# Patient Record
Sex: Female | Born: 1980 | Race: Black or African American | Hispanic: No | Marital: Married | Smoking: Never smoker
Health system: Southern US, Community
[De-identification: ages and names within clinical notes are randomized; demographics above are authoritative.]

## PROBLEM LIST (undated history)

## (undated) DIAGNOSIS — O161 Unspecified maternal hypertension, first trimester: Secondary | ICD-10-CM

## (undated) DIAGNOSIS — E119 Type 2 diabetes mellitus without complications: Secondary | ICD-10-CM

## (undated) HISTORY — DX: Unspecified maternal hypertension, first trimester: O16.1

## (undated) HISTORY — DX: Type 2 diabetes mellitus without complications: E11.9

---

## 2020-01-16 DIAGNOSIS — O321XX0 Maternal care for breech presentation, not applicable or unspecified: Secondary | ICD-10-CM

## 2021-02-10 ENCOUNTER — Ambulatory Visit: Payer: Self-pay | Admitting: Nurse Practitioner

## 2021-02-16 ENCOUNTER — Other Ambulatory Visit: Payer: Self-pay

## 2021-02-16 ENCOUNTER — Ambulatory Visit: Payer: BC Managed Care – PPO | Admitting: Nurse Practitioner

## 2021-02-16 ENCOUNTER — Encounter: Payer: Self-pay | Admitting: Nurse Practitioner

## 2021-02-16 VITALS — BP 136/84 | HR 88 | Temp 98.1°F | Resp 16 | Ht 64.0 in | Wt 166.5 lb

## 2021-02-16 DIAGNOSIS — Z131 Encounter for screening for diabetes mellitus: Secondary | ICD-10-CM

## 2021-02-16 DIAGNOSIS — Z8632 Personal history of gestational diabetes: Secondary | ICD-10-CM

## 2021-02-16 DIAGNOSIS — R030 Elevated blood-pressure reading, without diagnosis of hypertension: Secondary | ICD-10-CM | POA: Diagnosis not present

## 2021-02-16 DIAGNOSIS — Z13220 Encounter for screening for lipoid disorders: Secondary | ICD-10-CM | POA: Diagnosis not present

## 2021-02-16 DIAGNOSIS — Z7689 Persons encountering health services in other specified circumstances: Secondary | ICD-10-CM

## 2021-02-16 LAB — HEMOGLOBIN A1C
Hgb A1c MFr Bld: 13 % of total Hgb — ABNORMAL HIGH (ref <5.7)
Mean Plasma Glucose: 326 mg/dL
eAG (mmol/L): 18.1 mmol/L

## 2021-02-16 LAB — CBC WITH DIFFERENTIAL/PLATELET
Absolute Monocytes: 343 cells/uL (ref 200–950)
Basophils Absolute: 71 cells/uL (ref 0–200)
Basophils Relative: 1.5 %
Eosinophils Absolute: 42 cells/uL (ref 15–500)
Eosinophils Relative: 0.9 %
HCT: 43.9 % (ref 35.0–45.0)
Hemoglobin: 14.5 g/dL (ref 11.7–15.5)
Lymphs Abs: 2077 cells/uL (ref 850–3900)
MCH: 29 pg (ref 27.0–33.0)
MCHC: 33 g/dL (ref 32.0–36.0)
MCV: 87.8 fL (ref 80.0–100.0)
MPV: 13.3 fL — ABNORMAL HIGH (ref 7.5–12.5)
Monocytes Relative: 7.3 %
Neutro Abs: 2167 cells/uL (ref 1500–7800)
Neutrophils Relative %: 46.1 %
Platelets: 204 10*3/uL (ref 140–400)
RBC: 5 10*6/uL (ref 3.80–5.10)
RDW: 12 % (ref 11.0–15.0)
Total Lymphocyte: 44.2 %
WBC: 4.7 10*3/uL (ref 3.8–10.8)

## 2021-02-16 LAB — LIPID PANEL
Cholesterol: 232 mg/dL — ABNORMAL HIGH (ref <200)
HDL: 46 mg/dL — ABNORMAL LOW (ref >=50)
LDL Cholesterol (Calc): 147 mg/dL (calc) — ABNORMAL HIGH
Non-HDL Cholesterol (Calc): 186 mg/dL (calc) — ABNORMAL HIGH (ref <130)
Total CHOL/HDL Ratio: 5 (calc) — ABNORMAL HIGH (ref <5.0)
Triglycerides: 239 mg/dL — ABNORMAL HIGH (ref <150)

## 2021-02-16 LAB — COMPLETE METABOLIC PANEL WITH GFR
AG Ratio: 1.4 (calc) (ref 1.0–2.5)
ALT: 14 U/L (ref 6–29)
AST: 17 U/L (ref 10–30)
Albumin: 4.2 g/dL (ref 3.6–5.1)
Alkaline phosphatase (APISO): 124 U/L (ref 31–125)
BUN: 8 mg/dL (ref 7–25)
CO2: 28 mmol/L (ref 20–32)
Calcium: 9.4 mg/dL (ref 8.6–10.2)
Chloride: 99 mmol/L (ref 98–110)
Creat: 0.96 mg/dL (ref 0.50–0.97)
Globulin: 3 g/dL (calc) (ref 1.9–3.7)
Glucose, Bld: 342 mg/dL — ABNORMAL HIGH (ref 65–99)
Potassium: 4.6 mmol/L (ref 3.5–5.3)
Sodium: 133 mmol/L — ABNORMAL LOW (ref 135–146)
Total Bilirubin: 0.4 mg/dL (ref 0.2–1.2)
Total Protein: 7.2 g/dL (ref 6.1–8.1)
eGFR: 77 mL/min/{1.73_m2} (ref >=60)

## 2021-02-16 NOTE — Progress Notes (Addendum)
BP 136/84    Pulse 88    Temp 98.1 F (36.7 C) (Oral)    Resp 16    Ht 5\' 4"  (1.626 m)    Wt 166 lb 8 oz (75.5 kg)    LMP 01/30/2021    SpO2 99%    BMI 28.58 kg/m    Subjective:    Patient ID: 02/01/2021, female    DOB: 08/20/1980, 41 y.o.   MRN: [id number]  HPI: Jessica Patel is a 41 y.o. female, here with husband  Chief Complaint  Patient presents with   Establish Care   Hypertension   Hyperglycemia   High blood pressure reading:.  She says with her first and second pregnancy she had high blood pressure. She says she has a machine she uses at home to check her blood pressure.  Her blood pressure was 140's/80s at home. Her blood pressure here was 136/84.  She denies any chest pain, shortness of breath, lower extremity edema or blurred vision. Discussed decreasing salt intake. Continue to monitor blood pressure.  She is going to bring her blood pressure machine to check for accuracy at next visit. Will get labs.  Gestational diabetes:  She says she had gestational diabetes with her first pregnancy.  She denies any polyuria, polydipsia, or polyphagia.  Establish care:  She says her last physical was in August.  She says her only history is that her blood sugar was high with her first pregnancy and that she had high blood pressure with both pregnancies.  She has two daughters ages one and three. She is going to sign the release to get records from her previous provider.   Depression screening negative Depression screen [facility] 2/9 02/16/2021  Decreased Interest 0  Down, Depressed, Hopeless 0  PHQ - 2 Score 0  Altered sleeping 0  Tired, decreased energy 0  Change in appetite 0  Feeling bad or failure about yourself  0  Trouble concentrating 0  Moving slowly or fidgety/restless 0  Suicidal thoughts 0  PHQ-9 Score 0  Difficult doing work/chores Not difficult at all     Relevant past medical, surgical, family and social history reviewed and updated as indicated. Interim medical  history since our last visit reviewed. Allergies and medications reviewed and updated.  Review of Systems  Constitutional: Negative for fever or weight change.  Respiratory: Negative for cough and shortness of breath.   Cardiovascular: Negative for chest pain or palpitations.  Gastrointestinal: Negative for abdominal pain, no bowel changes.  Musculoskeletal: Negative for gait problem or joint swelling.  Skin: Negative for rash.  Neurological: Negative for dizziness or headache.  No other specific complaints in a complete review of systems (except as listed in HPI above).      Objective:    BP 136/84    Pulse 88    Temp 98.1 F (36.7 C) (Oral)    Resp 16    Ht 5\' 4"  (1.626 m)    Wt 166 lb 8 oz (75.5 kg)    LMP 01/30/2021    SpO2 99%    BMI 28.58 kg/m   Wt Readings from Last 3 Encounters:  02/16/21 166 lb 8 oz (75.5 kg)    Physical Exam  Constitutional: Patient appears well-developed and well-nourished. Overweight, No distress.  HEENT: head atraumatic, normocephalic, pupils equal and reactive to light, neck supple, throat within normal limits Cardiovascular: Normal rate, regular rhythm and normal heart sounds.  No murmur heard. No BLE edema. Pulmonary/Chest: Effort normal and  breath sounds normal. No respiratory distress. Abdominal: Soft.  There is no tenderness. Psychiatric: Patient has a normal mood and affect. behavior is normal. Judgment and thought content normal.     Assessment & Plan:   1. Elevated blood pressure reading -decrease salt intake -push water -monitor blood pressure and being machine to next appointment - CBC with Differential/Platelet - COMPLETE METABOLIC PANEL WITH GFR  2. History of gestational diabetes  - Hemoglobin A1c  3. Encounter to establish care - CBC with Differential/Platelet - COMPLETE METABOLIC PANEL WITH GFR - Lipid panel - Hemoglobin A1c  4. Screening for cholesterol level  - Lipid panel  5. Screening for diabetes mellitus  -  COMPLETE METABOLIC PANEL WITH GFR - Hemoglobin A1c   Follow up plan: Return in about 4 weeks (around 03/16/2021) for follow up.

## 2021-02-17 ENCOUNTER — Encounter: Payer: Self-pay | Admitting: Nurse Practitioner

## 2021-02-17 ENCOUNTER — Other Ambulatory Visit: Payer: Self-pay

## 2021-02-17 ENCOUNTER — Telehealth (INDEPENDENT_AMBULATORY_CARE_PROVIDER_SITE_OTHER): Payer: BC Managed Care – PPO | Admitting: Nurse Practitioner

## 2021-02-17 DIAGNOSIS — E119 Type 2 diabetes mellitus without complications: Secondary | ICD-10-CM

## 2021-02-17 DIAGNOSIS — E782 Mixed hyperlipidemia: Secondary | ICD-10-CM | POA: Diagnosis not present

## 2021-02-17 MED ORDER — BLOOD GLUCOSE MONITOR KIT: PACK | 0 refills | Status: DC

## 2021-02-17 MED ORDER — PIOGLITAZONE HCL 15 MG PO TABS: 15.0000 mg | ORAL_TABLET | Freq: Every day | ORAL | 0 refills | Status: DC

## 2021-02-17 MED ORDER — METFORMIN HCL 500 MG PO TABS: 500.0000 mg | ORAL_TABLET | Freq: Two times a day (BID) | ORAL | 0 refills | Status: DC

## 2021-02-17 NOTE — Progress Notes (Signed)
Name: Jessica Patel   MRN: [id number]    DOB: [birth year]   Date:02/17/2021       Progress Note  Subjective  Chief Complaint  Chief Complaint  Patient presents with   Diabetes    New onset     I connected with  Arnoldo Hooker  on 02/17/21 at  1:00 PM EST by a video enabled telemedicine application and verified that I am speaking with the correct person using two identifiers.  I discussed the limitations of evaluation and management by telemedicine and the availability of in person appointments. The patient expressed understanding and agreed to proceed with a virtual visit  Staff also discussed with the patient that there may be a patient responsible charge related to this service. Patient Location: home Provider Location: [facility] Additional Individuals present: with husband  HPI  DM2: This appointment was scheduled to discuss lab results.  Discussed her lab work.  Discussed diabetes diagnosis. Her A1C is 13.  Discussed diabetes education and it was declined at this time.  Discussed ways to decrease sugar with life style modification. Discussed starting medication and different options.  Will start with actos 15 mg daily  and metformin 500 mg bid.  She is agreeable with plan.  Prescribed glucose monitor.  Discussed checking her blood sugar three times a week in the morning before she eats and keeping a record. She denies any polyuria, polydipsia or polyphagia.   Hyperlipidemia:  She is not currently on any medication. Her LDL was 147.  Her triglycerides were 239 but she was not fasting at that time.  Discussed ways to decrease LDL with diet.  Will continue to monitor.   There are no problems to display for this patient.   Social History   Tobacco Use   Smoking status: Never   Smokeless tobacco: Never  Substance Use Topics   Alcohol use: Never     Current Outpatient Medications:    diclofenac (VOLTAREN) 75 MG EC tablet, Take 75 mg by mouth 2 (two) times daily. (Patient not taking:  Reported on 02/16/2021), Disp: , Rfl:   No Known Allergies  I personally reviewed active problem list, medication list, allergies, notes from last encounter with the patient/caregiver today.  ROS  Constitutional: Negative for fever or weight change.  Respiratory: Negative for cough and shortness of breath.   Cardiovascular: Negative for chest pain or palpitations.  Gastrointestinal: Negative for abdominal pain, no bowel changes.  Musculoskeletal: Negative for gait problem or joint swelling.  Skin: Negative for rash.  Neurological: Negative for dizziness or headache.  No other specific complaints in a complete review of systems (except as listed in HPI above).   Objective  Virtual encounter, vitals not obtained.  There is no height or weight on file to calculate BMI.  Nursing Note and Vital Signs reviewed.  Physical Exam  Awake, alert and oriented  Results for orders placed or performed in visit on 02/16/21 (from the past 72 hour(s))  CBC with Differential/Platelet     Status: Abnormal   Collection Time: 02/16/21  2:45 PM  Result Value Ref Range   WBC 4.7 3.8 - 10.8 Thousand/uL   RBC 5.00 3.80 - 5.10 Million/uL   Hemoglobin 14.5 11.7 - 15.5 g/dL   HCT 43.9 35.0 - 45.0 %   MCV 87.8 80.0 - 100.0 fL   MCH 29.0 27.0 - 33.0 pg   MCHC 33.0 32.0 - 36.0 g/dL   RDW 12.0 11.0 - 15.0 %   Platelets 204 140 -  400 Thousand/uL   MPV 13.3 (H) 7.5 - 12.5 fL   Neutro Abs 2,167 1,500 - 7,800 cells/uL   Lymphs Abs 2,077 850 - 3,900 cells/uL   Absolute Monocytes 343 200 - 950 cells/uL   Eosinophils Absolute 42 15 - 500 cells/uL   Basophils Absolute 71 0 - 200 cells/uL   Neutrophils Relative % 46.1 %   Total Lymphocyte 44.2 %   Monocytes Relative 7.3 %   Eosinophils Relative 0.9 %   Basophils Relative 1.5 %  COMPLETE METABOLIC PANEL WITH GFR     Status: Abnormal   Collection Time: 02/16/21  2:45 PM  Result Value Ref Range   Glucose, Bld 342 (H) 65 - 99 mg/dL    Comment: .             Fasting reference interval . For someone without known diabetes, a glucose value >125 mg/dL indicates that they may have diabetes and this should be confirmed with a follow-up test. .    BUN 8 7 - 25 mg/dL   Creat 0.96 0.50 - 0.97 mg/dL   eGFR 77 > OR = 60 mL/min/1.73m    Comment: The eGFR is based on the CKD-EPI 2021 equation. To calculate  the new eGFR from a previous Creatinine or Cystatin C result, go to https://www.kidney.org/professionals/ kdoqi/gfr%5Fcalculator    BUN/Creatinine Ratio NOT APPLICABLE 6 - 22 (calc)   Sodium 133 (L) 135 - 146 mmol/L   Potassium 4.6 3.5 - 5.3 mmol/L   Chloride 99 98 - 110 mmol/L   CO2 28 20 - 32 mmol/L   Calcium 9.4 8.6 - 10.2 mg/dL   Total Protein 7.2 6.1 - 8.1 g/dL   Albumin 4.2 3.6 - 5.1 g/dL   Globulin 3.0 1.9 - 3.7 g/dL (calc)   AG Ratio 1.4 1.0 - 2.5 (calc)   Total Bilirubin 0.4 0.2 - 1.2 mg/dL   Alkaline phosphatase (APISO) 124 31 - 125 U/L   AST 17 10 - 30 U/L   ALT 14 6 - 29 U/L  Lipid panel     Status: Abnormal   Collection Time: 02/16/21  2:45 PM  Result Value Ref Range   Cholesterol 232 (H) <200 mg/dL   HDL 46 (L) > OR = 50 mg/dL   Triglycerides 239 (H) <150 mg/dL    Comment: . If a non-fasting specimen was collected, consider repeat triglyceride testing on a fasting specimen if clinically indicated.  JYates Decampet al. J. of Clin. Lipidol. [id number];1:791-505 .Marland Kitchen   LDL Cholesterol (Calc) 147 (H) mg/dL (calc)    Comment: Reference range: <100 . Desirable range <100 mg/dL for primary prevention;   <70 mg/dL for patients with CHD or diabetic patients  with > or = 2 CHD risk factors. .Marland KitchenLDL-C is now calculated using the Martin-Hopkins  calculation, which is a validated novel method providing  better accuracy than the Friedewald equation in the  estimation of LDL-C.  MCresenciano Genreet al. JAnnamaria Helling [id number];480(16: 2061-2068  (http://education.[employer].com/faq/FAQ164)    Total CHOL/HDL Ratio 5.0 (H) <5.0 (calc)   Non-HDL  Cholesterol (Calc) 186 (H) <130 mg/dL (calc)    Comment: For patients with diabetes plus 1 major ASCVD risk  factor, treating to a non-HDL-C goal of <100 mg/dL  (LDL-C of <70 mg/dL) is considered a therapeutic  option.   Hemoglobin A1c     Status: Abnormal   Collection Time: 02/16/21  2:45 PM  Result Value Ref Range   Hgb A1c MFr Bld 13.0 (H) <5.7 % of  total Hgb    Comment: For someone without known diabetes, a hemoglobin A1c value of 6.5% or greater indicates that they may have  diabetes and this should be confirmed with a follow-up  test. . For someone with known diabetes, a value <7% indicates  that their diabetes is well controlled and a value  greater than or equal to 7% indicates suboptimal  control. A1c targets should be individualized based on  duration of diabetes, age, comorbid conditions, and  other considerations. . Currently, no consensus exists regarding use of hemoglobin A1c for diagnosis of diabetes for children. .    Mean Plasma Glucose 326 mg/dL   eAG (mmol/L) 18.1 mmol/L    Assessment & Plan  1. Type 2 diabetes mellitus without complication, without long-term current use of insulin (HCC) -discussed life style modifications -discussed checking fasting blood sugar - metFORMIN (GLUCOPHAGE) 500 MG tablet; Take 1 tablet (500 mg total) by mouth 2 (two) times daily with a meal.  Dispense: 180 tablet; Refill: 0 - blood glucose meter kit and supplies KIT; Dispense based on patient and insurance preference. Use up to four times daily as directed.  Dispense: 1 each; Refill: 0 - pioglitazone (ACTOS) 15 MG tablet; Take 1 tablet (15 mg total) by mouth daily.  Dispense: 90 tablet; Refill: 0  2. Mixed hyperlipidemia -discussed life style modifications   -Red flags and when to present for emergency care or RTC including fever >101.51F, chest pain, shortness of breath, new/worsening/un-resolving symptoms,  reviewed with patient at time of visit. Follow up and care  instructions discussed and provided in AVS. - I discussed the assessment and treatment plan with the patient. The patient was provided an opportunity to ask questions and all were answered. The patient agreed with the plan and demonstrated an understanding of the instructions.  I provided 20 minutes of non-face-to-face time during this encounter.  Bo Merino, FNP

## 2021-03-17 ENCOUNTER — Other Ambulatory Visit: Payer: Self-pay

## 2021-03-17 ENCOUNTER — Encounter: Payer: Self-pay | Admitting: Nurse Practitioner

## 2021-03-17 ENCOUNTER — Ambulatory Visit: Payer: BC Managed Care – PPO | Admitting: Nurse Practitioner

## 2021-03-17 VITALS — BP 128/86 | HR 88 | Temp 98.3°F | Resp 18 | Ht 65.0 in | Wt 160.2 lb

## 2021-03-17 DIAGNOSIS — E119 Type 2 diabetes mellitus without complications: Secondary | ICD-10-CM | POA: Diagnosis not present

## 2021-03-17 DIAGNOSIS — R030 Elevated blood-pressure reading, without diagnosis of hypertension: Secondary | ICD-10-CM

## 2021-03-17 MED ORDER — METFORMIN HCL 500 MG PO TABS: 500.0000 mg | ORAL_TABLET | Freq: Two times a day (BID) | ORAL | 3 refills | Status: DC

## 2021-03-17 MED ORDER — PIOGLITAZONE HCL 15 MG PO TABS: 15.0000 mg | ORAL_TABLET | Freq: Every day | ORAL | 3 refills | Status: DC

## 2021-03-17 NOTE — Progress Notes (Signed)
BP 128/86    Pulse 88    Temp 98.3 F (36.8 C) (Oral)    Resp 18    Ht 5' 5"  (1.651 m)    Wt 160 lb 3.2 oz (72.7 kg)    LMP 03/05/2021    SpO2 98%    BMI 26.66 kg/m    Subjective:    Patient ID: Jessica Patel, female    DOB: 1980-09-18, 41 y.o.   MRN: [id number]  HPI: Jessica Patel is a 41 y.o. female, here with husband  Chief Complaint  Patient presents with   Diabetes   Elevated blood pressure reading: She says she has had elevated blood pressure at home. Had her bring her machine in and it is giving inaccurate blood pressure readings compared to what was gotten in the office.  Discussed she needs a new machine this one is not calibrated.  Blood pressure in the office is 128/86.  She says she sometimes has headaches. She denies any chest pain, shortness of breath or blurred vision.  Recommended she get another machine and we will continue to monitor blood pressure in the office.   Type 2 diabetes:  She was recently diagnosed with diabetes on 02/16/2021.  Her A1C was 13. She was prescribed actos and metformin but never started the medication.  Her husband stated that there was a problem with their insurance card and it was too expensive.  Looked up medication on [facility] and gave husband [facility] card to get prescriptions.  She say she has been checking her blood sugar and it has been around 120s.  She denies any polyuria, polydipsia and polyphagia.  Discussed routine testing that will need to be done now that she is diabetic.  Foot exam performed today and placed referral for DM eye exam.  Will also get urine microalbumin.  Follow-up in  three months.   Relevant past medical, surgical, family and social history reviewed and updated as indicated. Interim medical history since our last visit reviewed. Allergies and medications reviewed and updated.  Review of Systems  Constitutional: Negative for fever or weight change.  Respiratory: Negative for cough and shortness of  breath.   Cardiovascular: Negative for chest pain or palpitations.  Gastrointestinal: Negative for abdominal pain, no bowel changes.  Musculoskeletal: Negative for gait problem or joint swelling.  Skin: Negative for rash.  Neurological: Negative for dizziness, positive headache.  No other specific complaints in a complete review of systems (except as listed in HPI above).      Objective:    BP 128/86    Pulse 88    Temp 98.3 F (36.8 C) (Oral)    Resp 18    Ht 5' 5"  (1.651 m)    Wt 160 lb 3.2 oz (72.7 kg)    LMP 03/05/2021    SpO2 98%    BMI 26.66 kg/m   Wt Readings from Last 3 Encounters:  03/17/21 160 lb 3.2 oz (72.7 kg)  02/16/21 166 lb 8 oz (75.5 kg)    Physical Exam  Constitutional: Patient appears well-developed and well-nourished. Overweight No distress.  HEENT: head atraumatic, normocephalic, pupils equal and reactive to light, neck supple, throat within normal limits Cardiovascular: Normal rate, regular rhythm and normal heart sounds.  No murmur heard. No BLE edema. Pulmonary/Chest: Effort normal and breath sounds normal. No respiratory distress. Abdominal: Soft.  There is no tenderness. Psychiatric: Patient has a normal mood and affect. behavior is normal. Judgment and thought content normal.  Diabetic Foot Exam - Simple   Simple Foot Patel Diabetic Foot exam was performed with the following findings: Yes 03/17/2021  1:35 PM  Visual Inspection No deformities, no ulcerations, no other skin breakdown bilaterally: Yes Sensation Testing Intact to touch and monofilament testing bilaterally: Yes Pulse Check Posterior Tibialis and Dorsalis pulse intact bilaterally: Yes Comments     Results for orders placed or performed in visit on 02/16/21  CBC with Differential/Platelet  Result Value Ref Range   WBC 4.7 3.8 - 10.8 Thousand/uL   RBC 5.00 3.80 - 5.10 Million/uL   Hemoglobin 14.5 11.7 - 15.5 g/dL   HCT 43.9 35.0 - 45.0 %   MCV 87.8 80.0 - 100.0 fL   MCH 29.0 27.0 -  33.0 pg   MCHC 33.0 32.0 - 36.0 g/dL   RDW 12.0 11.0 - 15.0 %   Platelets 204 140 - 400 Thousand/uL   MPV 13.3 (H) 7.5 - 12.5 fL   Neutro Abs 2,167 1,500 - 7,800 cells/uL   Lymphs Abs 2,077 850 - 3,900 cells/uL   Absolute Monocytes 343 200 - 950 cells/uL   Eosinophils Absolute 42 15 - 500 cells/uL   Basophils Absolute 71 0 - 200 cells/uL   Neutrophils Relative % 46.1 %   Total Lymphocyte 44.2 %   Monocytes Relative 7.3 %   Eosinophils Relative 0.9 %   Basophils Relative 1.5 %  COMPLETE METABOLIC PANEL WITH GFR  Result Value Ref Range   Glucose, Bld 342 (H) 65 - 99 mg/dL   BUN 8 7 - 25 mg/dL   Creat 0.96 0.50 - 0.97 mg/dL   eGFR 77 > OR = 60 mL/min/1.75m2   BUN/Creatinine Ratio NOT APPLICABLE 6 - 22 (calc)   Sodium 133 (L) 135 - 146 mmol/L   Potassium 4.6 3.5 - 5.3 mmol/L   Chloride 99 98 - 110 mmol/L   CO2 28 20 - 32 mmol/L   Calcium 9.4 8.6 - 10.2 mg/dL   Total Protein 7.2 6.1 - 8.1 g/dL   Albumin 4.2 3.6 - 5.1 g/dL   Globulin 3.0 1.9 - 3.7 g/dL (calc)   AG Ratio 1.4 1.0 - 2.5 (calc)   Total Bilirubin 0.4 0.2 - 1.2 mg/dL   Alkaline phosphatase (APISO) 124 31 - 125 U/L   AST 17 10 - 30 U/L   ALT 14 6 - 29 U/L  Lipid panel  Result Value Ref Range   Cholesterol 232 (H) <200 mg/dL   HDL 46 (L) > OR = 50 mg/dL   Triglycerides 239 (H) <150 mg/dL   LDL Cholesterol (Calc) 147 (H) mg/dL (calc)   Total CHOL/HDL Ratio 5.0 (H) <5.0 (calc)   Non-HDL Cholesterol (Calc) 186 (H) <130 mg/dL (calc)  Hemoglobin A1c  Result Value Ref Range   Hgb A1c MFr Bld 13.0 (H) <5.7 % of total Hgb   Mean Plasma Glucose 326 mg/dL   eAG (mmol/L) 18.1 mmol/L      Assessment & Plan:   1. Elevated blood pressure reading -get new blood pressure machine -continue to monitor at appointments  2. Type 2 diabetes mellitus without complication, without long-term current use of insulin (HCC)  - HM Diabetes Foot Exam - Ambulatory referral to Optometry - Microalbumin / creatinine urine ratio -  pioglitazone (ACTOS) 15 MG tablet; Take 1 tablet (15 mg total) by mouth daily.  Dispense: 30 tablet; Refill: 3 - metFORMIN (GLUCOPHAGE) 500 MG tablet; Take 1 tablet (500 mg total) by mouth 2 (two) times daily with a meal.  Dispense: 60 tablet; Refill: 3   Follow up plan: Return in about 3 months (around 06/14/2021) for follow up.

## 2021-03-18 LAB — MICROALBUMIN / CREATININE URINE RATIO
Creatinine, Urine: 78 mg/dL (ref 20–275)
Microalb Creat Ratio: 6 mcg/mg creat (ref <30)
Microalb, Ur: 0.5 mg/dL

## 2021-04-05 ENCOUNTER — Ambulatory Visit: Payer: Self-pay | Admitting: Nurse Practitioner

## 2021-05-03 ENCOUNTER — Encounter: Payer: Self-pay | Admitting: Family Medicine

## 2021-06-14 ENCOUNTER — Ambulatory Visit: Payer: BC Managed Care – PPO | Admitting: Nurse Practitioner

## 2021-06-15 ENCOUNTER — Ambulatory Visit: Payer: BC Managed Care – PPO | Admitting: Nurse Practitioner

## 2021-06-25 ENCOUNTER — Ambulatory Visit: Payer: BC Managed Care – PPO | Admitting: Nurse Practitioner

## 2021-06-25 ENCOUNTER — Ambulatory Visit (INDEPENDENT_AMBULATORY_CARE_PROVIDER_SITE_OTHER): Payer: BC Managed Care – PPO | Admitting: Nurse Practitioner

## 2021-06-25 ENCOUNTER — Other Ambulatory Visit: Payer: Self-pay

## 2021-06-25 ENCOUNTER — Encounter: Payer: Self-pay | Admitting: Nurse Practitioner

## 2021-06-25 VITALS — BP 134/78 | HR 84 | Temp 98.4°F | Resp 16 | Ht 65.0 in | Wt 159.2 lb

## 2021-06-25 DIAGNOSIS — E1165 Type 2 diabetes mellitus with hyperglycemia: Secondary | ICD-10-CM

## 2021-06-25 DIAGNOSIS — R030 Elevated blood-pressure reading, without diagnosis of hypertension: Secondary | ICD-10-CM | POA: Diagnosis not present

## 2021-06-25 LAB — POCT GLYCOSYLATED HEMOGLOBIN (HGB A1C): Hemoglobin A1C: 9.9 % — AB (ref 4.0–5.6)

## 2021-06-25 MED ORDER — PIOGLITAZONE HCL 15 MG PO TABS: 15.0000 mg | ORAL_TABLET | Freq: Every day | ORAL | 3 refills | Status: DC

## 2021-06-25 MED ORDER — METFORMIN HCL 500 MG PO TABS: 500.0000 mg | ORAL_TABLET | Freq: Two times a day (BID) | ORAL | 3 refills | Status: DC

## 2021-06-25 NOTE — Progress Notes (Signed)
BP 134/78   Pulse 84   Temp 98.4 F (36.9 C) (Oral)   Resp 16   Ht 5\' 5"  (1.651 m)   Wt 159 lb 3.2 oz (72.2 kg)   SpO2 99%   BMI 26.49 kg/m    Subjective:    Patient ID: , female    DOB: 1980-06-05, 41 y.o.   MRN: [id number]  HPI: Jessica Patel is a 41 y.o. female  Chief Complaint  Patient presents with   Hypertension   Diabetes    3 month follow up   Uncontrolled type 2 diabetes: Her last A1c was 13.0.  She says her blood sugars have been running 123-150.  She is not currently taking metformin 500 mg 2 times a day and Actos 15 mg daily.  She was prescribed but did not get the medication.  Her husband reports that he was unable to get the medication was too expensive.  Patient was given good Rx card he said that when he went to [facility] with a good Rx card they told him he needed to go to [facility].  I have now sent the prescriptions to [facility] for the patient to get.  She says she has been exercising and watching what she eats. She denies any polyuria, polyphagia or polydipsia.  Her A1c today is 9.9.  Discussed with patient that she has improved her A1c.  Discussed goal.  Hopefully patient will reach goal when starting medication.  Elevated blood pressure reading: At her first visit we discussed elevated blood pressure readings she was getting at home.  She said that she had high blood pressure with her first and second pregnancy. Discussed decreasing salt intake.  Her blood pressure in office was 136/84.  At her next visit her blood pressure was 128/86. Today her blood pressure is 134/78.  She says she has stopped checking her blood pressure at home. She says she has not had any headaches, chest pain,blurred vision or shortness of breath.   Relevant past medical, surgical, family and social history reviewed and updated as indicated. Interim medical history since our last visit reviewed. Allergies and medications reviewed and updated.  Review of  Systems  Constitutional: Negative for fever or weight change.  Respiratory: Negative for cough and shortness of breath.   Cardiovascular: Negative for chest pain or palpitations.  Gastrointestinal: Negative for abdominal pain, no bowel changes.  Musculoskeletal: Negative for gait problem or joint swelling.  Skin: Negative for rash.  Neurological: Negative for dizziness or headache.  No other specific complaints in a complete review of systems (except as listed in HPI above).      Objective:    BP 134/78   Pulse 84   Temp 98.4 F (36.9 C) (Oral)   Resp 16   Ht 5\' 5"  (1.651 m)   Wt 159 lb 3.2 oz (72.2 kg)   SpO2 99%   BMI 26.49 kg/m   Wt Readings from Last 3 Encounters:  06/25/21 159 lb 3.2 oz (72.2 kg)  03/17/21 160 lb 3.2 oz (72.7 kg)  02/16/21 166 lb 8 oz (75.5 kg)    Physical Exam  Constitutional: Patient appears well-developed and well-nourished.  No distress.  HEENT: head atraumatic, normocephalic, pupils equal and reactive to light,  neck supple Cardiovascular: Normal rate, regular rhythm and normal heart sounds.  No murmur heard. No BLE edema. Pulmonary/Chest: Effort normal and breath sounds normal. No respiratory distress. Abdominal: Soft.  There is no tenderness. Psychiatric: Patient has a normal  mood and affect. behavior is normal. Judgment and thought content normal.  Results for orders placed or performed in visit on 06/25/21  POCT HgB A1C  Result Value Ref Range   Hemoglobin A1C 9.9 (A) 4.0 - 5.6 %   HbA1c POC (<> result, manual entry)     HbA1c, POC (prediabetic range)     HbA1c, POC (controlled diabetic range)        Assessment & Plan:   Problem List Items Addressed This Visit       Endocrine   Type 2 diabetes mellitus without complication, without long-term current use of insulin (HCC) - Primary   Relevant Medications   pioglitazone (ACTOS) 15 MG tablet   metFORMIN (GLUCOPHAGE) 500 MG tablet   Other Visit Diagnoses     Elevated blood pressure  reading       Patient has normal blood pressure in office.          Follow up plan: Return in about 4 months (around 10/26/2021) for follow up.

## 2021-07-06 ENCOUNTER — Encounter: Payer: Self-pay | Admitting: Obstetrics and Gynecology

## 2021-07-06 ENCOUNTER — Ambulatory Visit: Payer: BC Managed Care – PPO | Admitting: Obstetrics and Gynecology

## 2021-07-06 ENCOUNTER — Other Ambulatory Visit (HOSPITAL_COMMUNITY)
Admission: RE | Admit: 2021-07-06 | Discharge: 2021-07-06 | Disposition: A | Discharge status: Home / Self Care | Payer: BC Managed Care – PPO | Source: Ambulatory Visit | Attending: Obstetrics and Gynecology | Admitting: Obstetrics and Gynecology

## 2021-07-06 VITALS — BP 145/92 | HR 88 | Wt 161.0 lb

## 2021-07-06 DIAGNOSIS — N912 Amenorrhea, unspecified: Secondary | ICD-10-CM

## 2021-07-06 DIAGNOSIS — Z1231 Encounter for screening mammogram for malignant neoplasm of breast: Secondary | ICD-10-CM

## 2021-07-06 DIAGNOSIS — Z8742 Personal history of other diseases of the female genital tract: Secondary | ICD-10-CM

## 2021-07-06 DIAGNOSIS — R030 Elevated blood-pressure reading, without diagnosis of hypertension: Secondary | ICD-10-CM

## 2021-07-06 DIAGNOSIS — Z789 Other specified health status: Secondary | ICD-10-CM | POA: Diagnosis not present

## 2021-07-06 NOTE — Progress Notes (Signed)
New Patient presents for Annual Exam today.  Last pap: 09/16/2020  Last Mammogram: Never Family Hx of Breast Cancer: None STD Screening: Declines    [id number]

## 2021-07-06 NOTE — Progress Notes (Signed)
Obstetrics and Gynecology New Patient Evaluation  Appointment Date: 07/06/2021  OBGYN Clinic: [facility] for [facility]  Primary Care Provider: Bo Merino  Referring Provider: Bo Merino, FNP  Chief Complaint:  Chief Complaint  Patient presents with   Gynecologic Exam    History of Present Illness: Jessica Patel is a 41 y.o. G2P2(Patient's last menstrual period was 05/26/2021.), seen for the above chief complaint.   Patient had august 2022 pap smear in [facility] and was told to repeat it in six months  Review of Systems: A comprehensive review of systems was negative.   Patient Active Problem List   Diagnosis Date Noted   Language barrier 07/06/2021   Type 2 diabetes mellitus without complication, without long-term current use of insulin ([place]) 02/17/2021   Mixed hyperlipidemia 02/17/2021    Past Medical History:  Past Medical History:  Diagnosis Date   Diabetes mellitus without complication (HCC)    Elevated blood pressure affecting pregnancy in first trimester, antepartum     Past Surgical History:  Past Surgical History:  Procedure Laterality Date   CESAREAN SECTION      Past Obstetrical History:  OB History  Gravida Para Term Preterm AB Living  2         2  SAB IAB Ectopic Multiple Live Births               # Outcome Date GA Lbr Len/2nd Weight Sex Delivery Anes PTL Lv  2 Gravida           1 Gravida             Obstetric Comments  G2: 2021 c-section.   Past Gynecological History: As per HPI. Periods: q28-32d, 3d, not heavy or painful She is currently using no method for contraception.   Social History:  Social History   Socioeconomic History   Marital status: Married    Spouse name: Not on file   Number of children: Not on file   Years of education: Not on file   Highest education level: Not on file  Occupational History   Not on file  Tobacco Use   Smoking status: Never   Smokeless tobacco: Never  Vaping Use    Vaping Use: Never used  Substance and Sexual Activity   Alcohol use: Never   Drug use: Never   Sexual activity: Yes    Partners: Male  Other Topics Concern   Not on file  Social History Narrative   Originally from [place] and speak [place].    Social Determinants of Health   Financial Resource Strain: Not on file  Food Insecurity: Not on file  Transportation Needs: Not on file  Physical Activity: Not on file  Stress: Not on file  Social Connections: Not on file  Intimate Partner Violence: Not on file    Family History: History reviewed. No pertinent family history.  Medications Manual Meier. Charles had no medications administered during this visit. Current Outpatient Medications  Medication Sig Dispense Refill   blood glucose meter kit and supplies KIT Dispense based on patient and insurance preference. Use up to four times daily as directed. (Patient not taking: Reported on 03/17/2021) 1 each 0   metFORMIN (GLUCOPHAGE) 500 MG tablet Take 1 tablet (500 mg total) by mouth 2 (two) times daily with a meal. (Patient not taking: Reported on 07/06/2021) 60 tablet 3   pioglitazone (ACTOS) 15 MG tablet Take 1 tablet (15 mg total) by mouth daily. (Patient not taking: Reported on  07/06/2021) 30 tablet 3   No current facility-administered medications for this visit.    Allergies Patient has no known allergies.   Physical Exam:  BP (!) 145/92   Pulse 88   Wt 161 lb (73 kg)   LMP 05/26/2021   BMI 26.79 kg/m  Body mass index is 26.79 kg/m.  General appearance: Well nourished, well developed female in no acute distress.  Neck:  Supple, normal appearance, and no thyromegaly  Cardiovascular: normal s1 and s2.  No murmurs, rubs or gallops. Respiratory:  Clear to auscultation bilateral. Normal respiratory effort Abdomen: positive bowel sounds and no masses, hernias; diffusely non tender to palpation, non distended Breasts: breasts appear normal, no suspicious masses, no skin or nipple changes or  axillary nodes, normal palpation. Neuro/Psych:  Normal mood and affect.  Skin:  Warm and dry.  Lymphatic:  No inguinal lymphadenopathy.   Pelvic exam: is not limited by body habitus EGBUS: within normal limits Vagina: within normal limits and with no blood or discharge in the vault Cervix: normal appearing cervix without tenderness, discharge or lesions. Uterus:  nonenlarged and non tender Adnexa:  normal adnexa and no mass, fullness, tenderness Rectovaginal: deferred  Laboratory: none  Radiology: none  Assessment: pt stable  Plan:  1. Transient hypertension Follow up with PCP  2. History of abnormal cervical Pap smear - Cytology - PAP  3. Breast cancer screening by mammogram - MM 3D SCREEN BREAST BILATERAL; Future  4. Language barrier iPad interpreter used  5. GYN Declines anything for birth control. A1c improving with PCP care.  Patient states she took a home UPT yesterday and it was negative.  Patient to consider recheck with [place] for next week but recommended she recheck at home next week [facility] for pap results from [place] requested  RTC PRN  Durene Romans MD Attending Center for [facility])

## 2021-07-08 ENCOUNTER — Other Ambulatory Visit: Payer: Self-pay | Admitting: Nurse Practitioner

## 2021-07-08 DIAGNOSIS — E119 Type 2 diabetes mellitus without complications: Secondary | ICD-10-CM

## 2021-07-08 NOTE — Telephone Encounter (Signed)
Medication Refill - Medication: blood glucose meter kit and supplies KIT   Has the patient contacted their pharmacy? Yes.   (Agent: If no, request that the patient contact the pharmacy for the refill. If patient does not wish to contact the pharmacy document the reason why and proceed with request.) (Agent: If yes, when and what did the pharmacy advise?)  Preferred Pharmacy (with phone number or street name):  [id number] [address]  [address] Phone: [phone]: [phone]  Has the patient been seen for an appointment in the last year OR does the patient have an upcoming appointment? Yes.    Agent: Please be advised that RX refills may take up to 3 business days. We ask that you follow-up with your pharmacy.

## 2021-07-09 LAB — CYTOLOGY - PAP
Comment: NEGATIVE
Diagnosis: UNDETERMINED — AB
High risk HPV: NEGATIVE

## 2021-07-09 MED ORDER — BLOOD GLUCOSE MONITOR KIT: PACK | 0 refills | Status: AC

## 2021-07-09 NOTE — Telephone Encounter (Signed)
Requested Prescriptions  Pending Prescriptions Disp Refills  . blood glucose meter kit and supplies KIT 1 each 0    Sig: Dispense based on patient and insurance preference. Use up to four times daily as directed.     Endocrinology: Diabetes - Testing Supplies Passed - 07/09/2021  8:11 AM      Passed - Valid encounter within last 12 months    Recent Outpatient Visits          2 weeks ago Uncontrolled type 2 diabetes mellitus with hyperglycemia [facility])   [facility] Serafina Royals F, FNP   3 months ago Elevated blood pressure reading   [facility] Serafina Royals F, FNP   4 months ago Type 2 diabetes mellitus without complication, without long-term current use of insulin [facility])   [place], FNP   4 months ago Elevated blood pressure reading   [place], FNP      Future Appointments            In 3 months Reece Packer, Myna Hidalgo, [facility], [facility]

## 2021-07-12 ENCOUNTER — Telehealth: Payer: Self-pay

## 2021-07-12 NOTE — Telephone Encounter (Signed)
-----   Message from [place] Bing, MD sent at 07/10/2021 12:57 PM EDT ----- Can you let her know that her pap is HPV negative? We are awaiting her pap results from [place], but as of right now, I recommend she repeat a pap in one year. I may change that recommendation based on what they saw in [place]. thanks

## 2021-07-12 NOTE — Telephone Encounter (Signed)
TC to pt to make aware of Pap results Using language line  Pt not ava vm full.

## 2021-07-16 ENCOUNTER — Ambulatory Visit
Admission: RE | Admit: 2021-07-16 | Discharge: 2021-07-16 | Disposition: A | Discharge status: Home / Self Care | Payer: BC Managed Care – PPO | Source: Ambulatory Visit | Attending: Obstetrics and Gynecology | Admitting: Obstetrics and Gynecology

## 2021-07-16 DIAGNOSIS — Z1231 Encounter for screening mammogram for malignant neoplasm of breast: Secondary | ICD-10-CM

## 2021-08-07 ENCOUNTER — Other Ambulatory Visit: Payer: Self-pay | Admitting: Nurse Practitioner

## 2021-08-09 NOTE — Telephone Encounter (Signed)
Requested test strips do not have a specific order to refill. Routing back to office.  Requested Prescriptions  Pending Prescriptions Disp Refills   ACCU-CHEK GUIDE test strip [Pharmacy Med Name: ACCU-CHEK GUIDE TEST STRIP] 100 strip     Sig: USE UP TO FOUR TIMES DAILY AS DIRECTED     Endocrinology: Diabetes - Testing Supplies Passed - 08/07/2021 11:11 AM      Passed - Valid encounter within last 12 months    Recent Outpatient Visits           1 month ago Uncontrolled type 2 diabetes mellitus with hyperglycemia [facility])   [facility] Della Goo F, FNP   4 months ago Elevated blood pressure reading   [facility] Della Goo F, FNP   5 months ago Type 2 diabetes mellitus without complication, without long-term current use of insulin [facility])   [facility] Della Goo F, FNP   5 months ago Elevated blood pressure reading   [facility] Berniece Salines, FNP       Future Appointments             In 2 months Zane Herald, Rudolpho Sevin, FNP [facility], [facility]

## 2021-10-05 ENCOUNTER — Ambulatory Visit (INDEPENDENT_AMBULATORY_CARE_PROVIDER_SITE_OTHER): Payer: BC Managed Care – PPO

## 2021-10-05 ENCOUNTER — Other Ambulatory Visit (HOSPITAL_COMMUNITY)
Admission: RE | Admit: 2021-10-05 | Discharge: 2021-10-05 | Disposition: A | Discharge status: Home / Self Care | Payer: BC Managed Care – PPO | Source: Ambulatory Visit | Attending: Obstetrics & Gynecology | Admitting: Obstetrics & Gynecology

## 2021-10-05 ENCOUNTER — Ambulatory Visit (INDEPENDENT_AMBULATORY_CARE_PROVIDER_SITE_OTHER): Payer: BC Managed Care – PPO | Admitting: *Deleted

## 2021-10-05 VITALS — BP 131/82 | HR 80 | Wt 155.0 lb

## 2021-10-05 DIAGNOSIS — O3680X0 Pregnancy with inconclusive fetal viability, not applicable or unspecified: Secondary | ICD-10-CM

## 2021-10-05 DIAGNOSIS — Z3481 Encounter for supervision of other normal pregnancy, first trimester: Secondary | ICD-10-CM

## 2021-10-05 DIAGNOSIS — E1165 Type 2 diabetes mellitus with hyperglycemia: Secondary | ICD-10-CM

## 2021-10-05 DIAGNOSIS — O34219 Maternal care for unspecified type scar from previous cesarean delivery: Secondary | ICD-10-CM | POA: Insufficient documentation

## 2021-10-05 DIAGNOSIS — O0990 Supervision of high risk pregnancy, unspecified, unspecified trimester: Secondary | ICD-10-CM | POA: Insufficient documentation

## 2021-10-05 DIAGNOSIS — Z3A11 11 weeks gestation of pregnancy: Secondary | ICD-10-CM | POA: Diagnosis not present

## 2021-10-05 DIAGNOSIS — O09529 Supervision of elderly multigravida, unspecified trimester: Secondary | ICD-10-CM | POA: Insufficient documentation

## 2021-10-05 DIAGNOSIS — O09511 Supervision of elderly primigravida, first trimester: Secondary | ICD-10-CM

## 2021-10-05 NOTE — Progress Notes (Signed)
New OB Intake  I explained I am completing New OB Intake today. We discussed her EDD of 04/23/22 that is based on LMP. LMP of 07/17/21. Pt is G3/P2. I reviewed her allergies, medications, Medical/Surgical/OB history, and appropriate screenings.   Patient Active Problem List   Diagnosis Date Noted   Encounter for supervision of high-risk pregnancy with multigravida of advanced maternal age 41/05/2021   Previous cesarean section complicating pregnancy 10/05/2021   Language barrier 07/06/2021   Type 2 diabetes mellitus without complication, without long-term current use of insulin (HCC) 02/17/2021   Mixed hyperlipidemia 02/17/2021    Concerns addressed today  Delivery Plans:  Plans to deliver at [facility].    Anatomy US Explained first scheduled [place] will be around 19 weeks.   Labs Discussed Avelina Laine genetic screening with patient. Would like Panorama. Routine prenatal labs drawn today, as well as high risk labs.   Placed OB Box on problem list and updated  Discussed for pt to start checking blood sugars, fasting and 2hr pp at least for one meal and bring the log in to her New OB visit with MD. Glucose log given.   Patient informed that the ultrasound is considered a limited obstetric ultrasound and is not intended to be a complete ultrasound exam.  Patient also informed that the ultrasound is not being completed with the intent of assessing for fetal or placental anomalies or any pelvic abnormalities. Explained that the purpose of today's ultrasound is to assess for dating and fetal heart rate.  Patient acknowledges the purpose of the exam and the limitations of the study.      First visit review I reviewed new OB appt with pt. Explained pt will be seen by Dr Macon Large at first visit.    Scheryl Marten, RN 10/05/2021  10:51 AM

## 2021-10-06 LAB — CBC/D/PLT+RPR+RH+ABO+RUBIGG...
Antibody Screen: NEGATIVE
Basophils Absolute: 0 10*3/uL (ref 0.0–0.2)
Basos: 1 %
EOS (ABSOLUTE): 0 10*3/uL (ref 0.0–0.4)
Eos: 0 %
HCV Ab: NONREACTIVE
HIV Screen 4th Generation wRfx: NONREACTIVE
Hematocrit: 42 % (ref 34.0–46.6)
Hemoglobin: 14.1 g/dL (ref 11.1–15.9)
Hepatitis B Surface Ag: NEGATIVE
Immature Grans (Abs): 0 10*3/uL (ref 0.0–0.1)
Immature Granulocytes: 0 %
Lymphocytes Absolute: 1.5 10*3/uL (ref 0.7–3.1)
Lymphs: 31 %
MCH: 29.6 pg (ref 26.6–33.0)
MCHC: 33.6 g/dL (ref 31.5–35.7)
MCV: 88 fL (ref 79–97)
Monocytes Absolute: 0.3 10*3/uL (ref 0.1–0.9)
Monocytes: 7 %
Neutrophils Absolute: 3 10*3/uL (ref 1.4–7.0)
Neutrophils: 61 %
Platelets: 178 10*3/uL (ref 150–450)
RBC: 4.76 x10E6/uL (ref 3.77–5.28)
RDW: 13.2 % (ref 11.7–15.4)
RPR Ser Ql: NONREACTIVE
Rh Factor: POSITIVE
Rubella Antibodies, IGG: 7.85 index (ref >0.99)
WBC: 4.9 10*3/uL (ref 3.4–10.8)

## 2021-10-06 LAB — COMPREHENSIVE METABOLIC PANEL
ALT: 8 IU/L (ref 0–32)
AST: 12 IU/L (ref 0–40)
Albumin/Globulin Ratio: 1.8 (ref 1.2–2.2)
Albumin: 4.4 g/dL (ref 3.9–4.9)
Alkaline Phosphatase: 74 IU/L (ref 44–121)
BUN/Creatinine Ratio: 7 — ABNORMAL LOW (ref 9–23)
BUN: 6 mg/dL (ref 6–24)
Bilirubin Total: 0.3 mg/dL (ref 0.0–1.2)
CO2: 19 mmol/L — ABNORMAL LOW (ref 20–29)
Calcium: 9.6 mg/dL (ref 8.7–10.2)
Chloride: 102 mmol/L (ref 96–106)
Creatinine, Ser: 0.89 mg/dL (ref 0.57–1.00)
Globulin, Total: 2.5 g/dL (ref 1.5–4.5)
Glucose: 181 mg/dL — ABNORMAL HIGH (ref 70–99)
Potassium: 4.2 mmol/L (ref 3.5–5.2)
Sodium: 136 mmol/L (ref 134–144)
Total Protein: 6.9 g/dL (ref 6.0–8.5)
eGFR: 84 mL/min/{1.73_m2} (ref >59)

## 2021-10-06 LAB — PROTEIN / CREATININE RATIO, URINE
Creatinine, Urine: 105.2 mg/dL
Protein, Ur: 14.3 mg/dL
Protein/Creat Ratio: 136 mg/g creat (ref 0–200)

## 2021-10-06 LAB — HEMOGLOBIN A1C
Est. average glucose Bld gHb Est-mCnc: 209 mg/dL
Hgb A1c MFr Bld: 8.9 % — ABNORMAL HIGH (ref 4.8–5.6)

## 2021-10-06 LAB — CERVICOVAGINAL ANCILLARY ONLY
Chlamydia: NEGATIVE
Comment: NEGATIVE
Comment: NORMAL
Neisseria Gonorrhea: NEGATIVE

## 2021-10-06 LAB — HCV INTERPRETATION

## 2021-10-06 LAB — TSH: TSH: 0.745 u[IU]/mL (ref 0.450–4.500)

## 2021-10-06 MED ORDER — METFORMIN HCL 500 MG PO TABS: 500.0000 mg | ORAL_TABLET | Freq: Two times a day (BID) | ORAL | 2 refills | Status: DC

## 2021-10-06 NOTE — Addendum Note (Signed)
Addended by: Jaynie Collins A on: 10/06/2021 06:10 PM   Modules accepted: Orders

## 2021-10-09 LAB — CULTURE, OB URINE

## 2021-10-09 LAB — URINE CULTURE, OB REFLEX

## 2021-10-11 ENCOUNTER — Encounter: Payer: Self-pay | Admitting: Obstetrics & Gynecology

## 2021-10-11 DIAGNOSIS — R8271 Bacteriuria: Secondary | ICD-10-CM | POA: Insufficient documentation

## 2021-10-12 ENCOUNTER — Encounter: Payer: Self-pay | Admitting: Obstetrics & Gynecology

## 2021-10-12 ENCOUNTER — Ambulatory Visit (INDEPENDENT_AMBULATORY_CARE_PROVIDER_SITE_OTHER): Payer: BC Managed Care – PPO | Admitting: Obstetrics & Gynecology

## 2021-10-12 VITALS — BP 124/82 | HR 83 | Wt 156.0 lb

## 2021-10-12 DIAGNOSIS — O24111 Pre-existing diabetes mellitus, type 2, in pregnancy, first trimester: Secondary | ICD-10-CM

## 2021-10-12 DIAGNOSIS — Z3A12 12 weeks gestation of pregnancy: Secondary | ICD-10-CM

## 2021-10-12 DIAGNOSIS — O34219 Maternal care for unspecified type scar from previous cesarean delivery: Secondary | ICD-10-CM

## 2021-10-12 DIAGNOSIS — O09529 Supervision of elderly multigravida, unspecified trimester: Secondary | ICD-10-CM

## 2021-10-12 DIAGNOSIS — O09521 Supervision of elderly multigravida, first trimester: Secondary | ICD-10-CM

## 2021-10-12 LAB — PANORAMA PRENATAL TEST FULL PANEL:PANORAMA TEST PLUS 5 ADDITIONAL MICRODELETIONS: FETAL FRACTION: 11.7

## 2021-10-12 MED ORDER — METFORMIN HCL 500 MG PO TABS: 1000.0000 mg | ORAL_TABLET | Freq: Two times a day (BID) | ORAL | 2 refills | Status: DC

## 2021-10-12 MED ORDER — ASPIRIN 81 MG PO TBEC: 81.0000 mg | DELAYED_RELEASE_TABLET | Freq: Every day | ORAL | 2 refills | Status: DC

## 2021-10-12 NOTE — Progress Notes (Signed)
History:   Jessica Patel is a 41 y.o. [id number] at 17w3dby LMP being seen today for her first obstetrical visit.  Her obstetrical history is significant for advanced maternal age and type 2 DM   Also has history of cesarean section due to breech presentation in 2021. Patient speaks [place] unable to get medical interpreter on the phone or the AMN, so husband helped with interpretation. Patient started on Metformin 500 mg po bid as instructed after RN intake visit, gets some nausea with this but able to tolerate it.   Patient does intend to breast feed. Pregnancy history fully reviewed.  Patient reports no complaints.      HISTORY: OB History  Gravida Para Term Preterm AB Living  _0 0 0 2  SAB IAB Ectopic Multiple Live Births  0 0 0 0 2    # Outcome Date GA Lbr Len/2nd Weight Sex Delivery Anes PTL Lv  3 Current           2 Term 01/16/20     CS-LTranv        Complications: Breech birth  1 Term 01/21/19     Vag-Spont   LIV    Obstetric Comments  G2: 2021 c-section.   Last pap smear was done 07/06/2021 and was  ASCUS but negative HRHPV.  Past Medical History:  Diagnosis Date   Diabetes mellitus without complication ([place]    Past Surgical History:  Procedure Laterality Date   CESAREAN SECTION     No family history on file. Social History   Tobacco Use   Smoking status: Never   Smokeless tobacco: Never  Vaping Use   Vaping Use: Never used  Substance Use Topics   Alcohol use: Never   Drug use: Never   No Known Allergies Current Outpatient Medications on File Prior to Visit  Medication Sig Dispense Refill   ACCU-CHEK GUIDE test strip USE UP TO FOUR TIMES DAILY AS DIRECTED 100 strip 5   blood glucose meter kit and supplies KIT Dispense based on patient and insurance preference. Use up to four times daily as directed. 1 each 0   No current facility-administered medications on file prior to visit.    Review of Systems Pertinent items noted  in HPI and remainder of comprehensive ROS otherwise negative.  Physical Exam:   Vitals:   10/12/21 1310  BP: 124/82  Pulse: 83  Weight: 156 lb (70.8 kg)   Fetal Heart Rate (bpm): 156   General: well-developed, well-nourished female in no acute distress  Breasts:  deferred  Skin: normal coloration and turgor, no rashes  Neurologic: oriented, normal, negative, normal mood  Extremities: normal strength, tone, and muscle mass, ROM of all joints is normal  HEENT PERRLA, extraocular movement intact and sclera clear, anicteric  Neck supple and no masses  Cardiovascular: regular rate and rhythm  Respiratory:  no respiratory distress, normal breath sounds  Abdomen: soft, non-tender; bowel sounds normal; no masses,  no organomegaly  Pelvic: deferred     Results for orders placed or performed in visit on 10/05/21 (from the past 504 hour(s))  Culture, OB Urine   Collection Time: 10/05/21 10:00 AM   Specimen: Urine   UR  Result Value Ref Range   Urine Culture, OB Final report (A)   Urine Culture, OB Reflex   Collection Time: 10/05/21 10:00 AM  Result Value Ref Range   Organism ID, Bacteria Comment (A)    Antimicrobial Susceptibility Comment  Cervicovaginal ancillary only   Collection Time: 10/05/21 10:41 AM  Result Value Ref Range   Neisseria Gonorrhea Negative    Chlamydia Negative    Comment Normal Reference Ranger Chlamydia - Negative    Comment      Normal Reference Range Neisseria Gonorrhea - Negative  Panorama Prenatal Test Full Panel   Collection Time: 10/05/21 10:53 AM  Result Value Ref Range   REPORT SUMMARY LOW RISK    REPORT NOTE See Notes    GENDER OF FETUS Female    FETAL FRACTION 11.7%    TRISOMY 21 RESULT TEXT Low Risk    TRISOMY 21 AGE-BASED RISK TEXT 1/51 (1.96%)    TRISOMY 21 RISK SCORE TEXT <1/10,000 (<0.01%)    TRISOMY 18 RESULT TEXT Low Risk    TRISOMY 18 AGE-BASED RISK TEXT 1/118 (0.85%)    TRISOMY 18 RISK SCORE TEXT <1/10,000 (<0.01%)    TRISOMY 13  RESULT TEXT Low Risk    TRISOMY 13 AGE-BASED RISK TEXT 1/373 (0.27%)    TRISOMY 13 RISK SCORE TEXT <1/10,000 (<0.01%)    MONOSOMY X RESULT TEXT Low Risk    MONOSOMY X AGE-BASED RISK TEXT 1/255 (0.39%)    MONOSOMY X RISK SCORE TEXT <1/10,000 (<0.01%)    TRIPLOIDY RESULT TEXT Low Risk    22Q11.2 DELETION SYNDROME RESULT TEXT Low Risk    22Q11.2 DELETION SYNDROME POPULATION-BASED RISK TEXT 1/2,000    22Q11.2 DELETION SYNDROME RISK SCORE TEXT 1/12,000    FOOTNOTES See Notes   CBC/D/Plt+RPR+Rh+ABO+RubIgG...   Collection Time: 10/05/21 11:14 AM  Result Value Ref Range   Hepatitis B Surface Ag Negative Negative   HCV Ab Non Reactive Non Reactive   RPR Ser Ql Non Reactive Non Reactive   Rubella Antibodies, IGG 7.85 Immune >0.99 index   ABO Grouping O    Rh Factor Positive    Antibody Screen Negative Negative   HIV Screen 4th Generation wRfx Non Reactive Non Reactive   WBC 4.9 3.4 - 10.8 x10E3/uL   RBC 4.76 3.77 - 5.28 x10E6/uL   Hemoglobin 14.1 11.1 - 15.9 g/dL   Hematocrit 42.0 34.0 - 46.6 %   MCV 88 79 - 97 fL   MCH 29.6 26.6 - 33.0 pg   MCHC 33.6 31.5 - 35.7 g/dL   RDW 13.2 11.7 - 15.4 %   Platelets 178 150 - 450 x10E3/uL   Neutrophils 61 Not Estab. %   Lymphs 31 Not Estab. %   Monocytes 7 Not Estab. %   Eos 0 Not Estab. %   Basos 1 Not Estab. %   Neutrophils Absolute 3.0 1.4 - 7.0 x10E3/uL   Lymphocytes Absolute 1.5 0.7 - 3.1 x10E3/uL   Monocytes Absolute 0.3 0.1 - 0.9 x10E3/uL   EOS (ABSOLUTE) 0.0 0.0 - 0.4 x10E3/uL   Basophils Absolute 0.0 0.0 - 0.2 x10E3/uL   Immature Granulocytes 0 Not Estab. %   Immature Grans (Abs) 0.0 0.0 - 0.1 x10E3/uL  Interpretation:   Collection Time: 10/05/21 11:14 AM  Result Value Ref Range   HCV Interp 1: Comment   Comprehensive metabolic panel   Collection Time: 10/05/21 11:21 AM  Result Value Ref Range   Glucose 181 (H) 70 - 99 mg/dL   BUN 6 6 - 24 mg/dL   Creatinine, Ser 0.89 0.57 - 1.00 mg/dL   eGFR 84 >59 mL/min/1.73    BUN/Creatinine Ratio 7 (L) 9 - 23   Sodium 136 134 - 144 mmol/L   Potassium 4.2 3.5 - 5.2 mmol/L  Chloride 102 96 - 106 mmol/L   CO2 19 (L) 20 - 29 mmol/L   Calcium 9.6 8.7 - 10.2 mg/dL   Total Protein 6.9 6.0 - 8.5 g/dL   Albumin 4.4 3.9 - 4.9 g/dL   Globulin, Total 2.5 1.5 - 4.5 g/dL   Albumin/Globulin Ratio 1.8 1.2 - 2.2   Bilirubin Total 0.3 0.0 - 1.2 mg/dL   Alkaline Phosphatase 74 44 - 121 IU/L   AST 12 0 - 40 IU/L   ALT 8 0 - 32 IU/L  Hemoglobin A1c   Collection Time: 10/05/21 11:21 AM  Result Value Ref Range   Hgb A1c MFr Bld 8.9 (H) 4.8 - 5.6 %   Est. average glucose Bld gHb Est-mCnc 209 mg/dL  Protein / creatinine ratio, urine   Collection Time: 10/05/21 11:21 AM  Result Value Ref Range   Creatinine, Urine 105.2 Not Estab. mg/dL   Protein, Ur 14.3 Not Estab. mg/dL   Protein/Creat Ratio 136 0 - 200 mg/g creat  TSH   Collection Time: 10/05/21 11:21 AM  Result Value Ref Range   TSH 0.745 0.450 - 4.500 uIU/mL     Assessment:    Pregnancy: [id number] Patient Active Problem List   Diagnosis Date Noted   Group B streptococcal bacteriuria 10/11/2021   Encounter for supervision of high-risk pregnancy with multigravida of advanced maternal age 36/05/2021   Previous cesarean section complicating pregnancy 93/71/6967   Language barrier 07/06/2021   Type 2 diabetes mellitus without complication, without long-term current use of insulin ([place]) 02/17/2021   Mixed hyperlipidemia 02/17/2021     Plan:    1. Pregnancy with type 2 diabetes mellitus in first trimester AIC 8.9. Reviewed logged values, all elevated in 120-150s for fasting and PPs. Increased Metformin to 1000 mg bid, told her of possible need for insulin soon. She is hesitant to do insulin. Ordered anatomy scan, fetal ECHO, eye exam, DM education.   Also ASA started.  Discussed implications of DM in pregnancy, need for optimizing glycemic control to decrease DM associated maternal-fetal morbidity and mortality, need for  antenatal testing and frequent ultrasounds/prenatal visits. - [place] MFM OB DETAIL +14 WK; Future - Amb Referral to Nutrition and Diabetic Education - aspirin EC 81 MG tablet; Take 1 tablet (81 mg total) by mouth daily. Take after 12 weeks for prevention of preeclampsia later in pregnancy  Dispense: 300 tablet; Refill: 2 - US Fetal Echocardiography; Future - Ambulatory referral to Ophthalmology - metFORMIN (GLUCOPHAGE) 500 MG tablet; Take 2 tablets (1,000 mg total) by mouth 2 (two) times daily with a meal.  Dispense: 120 tablet; Refill: 2  2. Previous cesarean section complicating pregnancy Will discuss mode of delivery later.  3. [redacted] weeks gestation of pregnancy 4. Encounter for supervision of high-risk pregnancy with multigravida of advanced maternal age Low risk NIPS. - [place] MFM OB DETAIL +14 WK; Future Initial labs reviewed Continue prenatal vitamins. Problem list reviewed and updated. Genetic Screening results discussed. Ultrasound discussed; fetal anatomic survey: scheduled. Anticipatory guidance about prenatal visits given including labs, ultrasounds, and testing. Weight gain recommendations per IOM guidelines reviewed: underweight/BMI 18.5 or less > 28 - 40 lbs; normal weight/BMI 18.5 - 24.9 > 25 - 35 lbs; overweight/BMI 25 - 29.9 > 15 - 25 lbs; obese/BMI  30 or more > 11 - 20 lbs. The nature of [place] for [facility] Healthcare/Faculty Practice with multiple MDs and Advanced Practice Providers was explained to patient; also emphasized that residents, students are part of our  team. Routine obstetric precautions reviewed. Encouraged to seek out care at our office or emergency room [facility] preferred) for urgent and/or emergent concerns. Return in about 2 weeks (around 10/26/2021) for OFFICE OB VISIT (MD only) and review of blood sugar.     Verita Schneiders, MD, [place] for [employer], [place]

## 2021-10-25 ENCOUNTER — Ambulatory Visit: Payer: BC Managed Care – PPO | Admitting: Nurse Practitioner

## 2021-10-26 ENCOUNTER — Encounter: Payer: BC Managed Care – PPO | Admitting: Obstetrics and Gynecology

## 2021-10-28 ENCOUNTER — Ambulatory Visit (INDEPENDENT_AMBULATORY_CARE_PROVIDER_SITE_OTHER): Payer: BC Managed Care – PPO | Admitting: Obstetrics & Gynecology

## 2021-10-28 ENCOUNTER — Ambulatory Visit: Payer: BC Managed Care – PPO | Admitting: Nurse Practitioner

## 2021-10-28 ENCOUNTER — Encounter: Payer: Self-pay | Admitting: Obstetrics & Gynecology

## 2021-10-28 VITALS — BP 117/76 | HR 81 | Wt 156.4 lb

## 2021-10-28 DIAGNOSIS — O34219 Maternal care for unspecified type scar from previous cesarean delivery: Secondary | ICD-10-CM

## 2021-10-28 DIAGNOSIS — O09529 Supervision of elderly multigravida, unspecified trimester: Secondary | ICD-10-CM

## 2021-10-28 DIAGNOSIS — E119 Type 2 diabetes mellitus without complications: Secondary | ICD-10-CM

## 2021-10-28 DIAGNOSIS — O09522 Supervision of elderly multigravida, second trimester: Secondary | ICD-10-CM

## 2021-10-28 DIAGNOSIS — Z3A14 14 weeks gestation of pregnancy: Secondary | ICD-10-CM

## 2021-10-28 NOTE — Progress Notes (Signed)
   PRENATAL VISIT NOTE  Subjective:  Jessica Patel is a 41 y.o. G3P2002 at [redacted]w[redacted]d being seen today for ongoing prenatal care. [place] AMN interpreter used for this encounter.  She is currently monitored for the following issues for this high-risk pregnancy and has Type 2 diabetes mellitus without complication, without long-term current use of insulin ([place]); Mixed hyperlipidemia; Language barrier; Encounter for supervision of high-risk pregnancy with multigravida of advanced maternal age; Previous cesarean section complicating pregnancy; and Group B streptococcal bacteriuria on their problem list.  Patient reports no complaints.  Contractions: Not present. Vag. Bleeding: None.  Movement: Present. Denies leaking of fluid.   The following portions of the patient's history were reviewed and updated as appropriate: allergies, current medications, past family history, past medical history, past social history, past surgical history and problem list.   Objective:   Vitals:   10/28/21 1038  BP: 117/76  Pulse: 81  Weight: 156 lb 6.4 oz (70.9 kg)    Fetal Status: Fetal Heart Rate (bpm): 140   Movement: Present     General:  Alert, oriented and cooperative. Patient is in no acute distress.  Skin: Skin is warm and dry. No rash noted.   Cardiovascular: Normal heart rate noted  Respiratory: Normal respiratory effort, no problems with respiration noted  Abdomen: Soft, gravid, appropriate for gestational age.  Pain/Pressure: Absent     Pelvic: Cervical exam deferred        Extremities: Normal range of motion.  Edema: None  Mental Status: Normal mood and affect. Normal behavior. Normal judgment and thought content.   Assessment and Plan:  Pregnancy: G3P2002 at [redacted]w[redacted]d 1. Type 2 diabetes mellitus without complication, without long-term current use of insulin (HCC) Reviewed BS, a few elevated lunch PP but overall reassuring. Continue Metformin 500 mg po bid for now.  Scheduled  for anatomy scan and fetal ECHO.  2. Previous cesarean section complicating pregnancy Will discuss delivery modality at later visit  3. [redacted] weeks gestation of pregnancy 4. Encounter for supervision of high-risk pregnancy with multigravida of advanced maternal age No other complaints or concerns.  Routine obstetric precautions reviewed. Please refer to After Visit Summary for other counseling recommendations.   Return in about 12 days (around 11/09/2021) for OFFICE OB VISIT (MD) as scheduled.  Future Appointments  Date Time Provider [place]  10/28/2021  2:20 PM Bo Merino, FNP [facility]  11/02/2021  2:15 PM [facility]  11/09/2021  1:10 PM Aletha Halim, MD [facility]  11/30/2021  1:00 PM [facility] US[facility]  11/30/2021  2:00 PM [facility] CONSULT RM [facility] None  12/07/2021  1:50 PM Javae Braaten, Sallyanne Havers, MD [facility]    Verita Schneiders, MD

## 2021-10-28 NOTE — Patient Instructions (Signed)

## 2021-10-28 NOTE — Progress Notes (Deleted)
LMP 07/17/2021    Subjective:    Patient ID: Jessica Patel, female    DOB: Oct 09, 1980, 41 y.o.   MRN: [id number]  HPI: Jessica Patel is a 41 y.o. female  No chief complaint on file.  Uncontrolled type 2 diabetes: Previously her A1c was 13.0.  At last visit her A1c was down to 9.9, she recently had labs done and her A1C was down to 8.9 on 10/05/2021.  Patient has really been working on her lifestyle modification.  Patient was prescribed metformin 500 mg 2 times a day.  Patient has ***started medication.  Patient reports her blood sugars have been running ***. Patient is [redacted] weeks pregnant.  She is seeing her OB/GYN Dr. Harolyn Rutherford.  She was last seen on 10/12/2021   Relevant past medical, surgical, family and social history reviewed and updated as indicated. Interim medical history since our last visit reviewed. Allergies and medications reviewed and updated.  Review of Systems  Constitutional: Negative for fever or weight change.  Respiratory: Negative for cough and shortness of breath.   Cardiovascular: Negative for chest pain or palpitations.  Gastrointestinal: Negative for abdominal pain, no bowel changes.  Musculoskeletal: Negative for gait problem or joint swelling.  Skin: Negative for rash.  Neurological: Negative for dizziness or headache.  No other specific complaints in a complete review of systems (except as listed in HPI above).      Objective:    LMP 07/17/2021   Wt Readings from Last 3 Encounters:  10/12/21 156 lb (70.8 kg)  10/05/21 155 lb (70.3 kg)  07/06/21 161 lb (73 kg)    Physical Exam  Constitutional: Patient appears well-developed and well-nourished.  No distress.  HEENT: head atraumatic, normocephalic, pupils equal and reactive to light,  neck supple Cardiovascular: Normal rate, regular rhythm and normal heart sounds.  No murmur heard. No BLE edema. Pulmonary/Chest: Effort normal and breath sounds normal. No  respiratory distress. Abdominal: Soft.  There is no tenderness. Psychiatric: Patient has a normal mood and affect. behavior is normal. Judgment and thought content normal.  Results for orders placed or performed in visit on 10/05/21  Culture, OB Urine   Specimen: Urine   UR  Result Value Ref Range   Urine Culture, OB Final report (A)   Urine Culture, OB Reflex  Result Value Ref Range   Organism ID, Bacteria Comment (A)    Antimicrobial Susceptibility Comment   CBC/D/Plt+RPR+Rh+ABO+RubIgG...  Result Value Ref Range   Hepatitis B Surface Ag Negative Negative   HCV Ab Non Reactive Non Reactive   RPR Ser Ql Non Reactive Non Reactive   Rubella Antibodies, IGG 7.85 Immune >0.99 index   ABO Grouping O    Rh Factor Positive    Antibody Screen Negative Negative   HIV Screen 4th Generation wRfx Non Reactive Non Reactive   WBC 4.9 3.4 - 10.8 x10E3/uL   RBC 4.76 3.77 - 5.28 x10E6/uL   Hemoglobin 14.1 11.1 - 15.9 g/dL   Hematocrit 42.0 34.0 - 46.6 %   MCV 88 79 - 97 fL   MCH 29.6 26.6 - 33.0 pg   MCHC 33.6 31.5 - 35.7 g/dL   RDW 13.2 11.7 - 15.4 %   Platelets 178 150 - 450 x10E3/uL   Neutrophils 61 Not Estab. %   Lymphs 31 Not Estab. %   Monocytes 7 Not Estab. %   Eos 0 Not Estab. %   Basos 1 Not Estab. %   Neutrophils Absolute 3.0 1.4 -  7.0 x10E3/uL   Lymphocytes Absolute 1.5 0.7 - 3.1 x10E3/uL   Monocytes Absolute 0.3 0.1 - 0.9 x10E3/uL   EOS (ABSOLUTE) 0.0 0.0 - 0.4 x10E3/uL   Basophils Absolute 0.0 0.0 - 0.2 x10E3/uL   Immature Granulocytes 0 Not Estab. %   Immature Grans (Abs) 0.0 0.0 - 0.1 x10E3/uL  Comprehensive metabolic panel  Result Value Ref Range   Glucose 181 (H) 70 - 99 mg/dL   BUN 6 6 - 24 mg/dL   Creatinine, Ser 0.89 0.57 - 1.00 mg/dL   eGFR 84 >59 mL/min/1.73   BUN/Creatinine Ratio 7 (L) 9 - 23   Sodium 136 134 - 144 mmol/L   Potassium 4.2 3.5 - 5.2 mmol/L   Chloride 102 96 - 106 mmol/L   CO2 19 (L) 20 - 29 mmol/L   Calcium 9.6 8.7 - 10.2 mg/dL   Total  Protein 6.9 6.0 - 8.5 g/dL   Albumin 4.4 3.9 - 4.9 g/dL   Globulin, Total 2.5 1.5 - 4.5 g/dL   Albumin/Globulin Ratio 1.8 1.2 - 2.2   Bilirubin Total 0.3 0.0 - 1.2 mg/dL   Alkaline Phosphatase 74 44 - 121 IU/L   AST 12 0 - 40 IU/L   ALT 8 0 - 32 IU/L  Hemoglobin A1c  Result Value Ref Range   Hgb A1c MFr Bld 8.9 (H) 4.8 - 5.6 %   Est. average glucose Bld gHb Est-mCnc 209 mg/dL  Protein / creatinine ratio, urine  Result Value Ref Range   Creatinine, Urine 105.2 Not Estab. mg/dL   Protein, Ur 14.3 Not Estab. mg/dL   Protein/Creat Ratio 136 0 - 200 mg/g creat  TSH  Result Value Ref Range   TSH 0.745 0.450 - 4.500 uIU/mL  Panorama Prenatal Test Full Panel  Result Value Ref Range   REPORT SUMMARY LOW RISK    REPORT NOTE See Notes    GENDER OF FETUS Female    FETAL FRACTION 11.7%    TRISOMY 21 RESULT TEXT Low Risk    TRISOMY 21 AGE-BASED RISK TEXT 1/51 (1.96%)    TRISOMY 21 RISK SCORE TEXT <1/10,000 (<0.01%)    TRISOMY 18 RESULT TEXT Low Risk    TRISOMY 18 AGE-BASED RISK TEXT 1/118 (0.85%)    TRISOMY 18 RISK SCORE TEXT <1/10,000 (<0.01%)    TRISOMY 13 RESULT TEXT Low Risk    TRISOMY 13 AGE-BASED RISK TEXT 1/373 (0.27%)    TRISOMY 13 RISK SCORE TEXT <1/10,000 (<0.01%)    MONOSOMY X RESULT TEXT Low Risk    MONOSOMY X AGE-BASED RISK TEXT 1/255 (0.39%)    MONOSOMY X RISK SCORE TEXT <1/10,000 (<0.01%)    TRIPLOIDY RESULT TEXT Low Risk    22Q11.2 DELETION SYNDROME RESULT TEXT Low Risk    22Q11.2 DELETION SYNDROME POPULATION-BASED RISK TEXT 1/2,000    22Q11.2 DELETION SYNDROME RISK SCORE TEXT 1/12,000    FOOTNOTES See Notes   Interpretation:  Result Value Ref Range   HCV Interp 1: Comment   Cervicovaginal ancillary only  Result Value Ref Range   Neisseria Gonorrhea Negative    Chlamydia Negative    Comment Normal Reference Ranger Chlamydia - Negative    Comment      Normal Reference Range Neisseria Gonorrhea - Negative      Assessment & Plan:   Problem List Items Addressed  This Visit   None    Follow up plan: No follow-ups on file.

## 2021-11-02 ENCOUNTER — Encounter: Payer: BC Managed Care – PPO | Attending: Nurse Practitioner | Admitting: Registered"

## 2021-11-02 ENCOUNTER — Encounter: Payer: BC Managed Care – PPO | Admitting: Obstetrics and Gynecology

## 2021-11-02 ENCOUNTER — Ambulatory Visit (INDEPENDENT_AMBULATORY_CARE_PROVIDER_SITE_OTHER): Payer: BC Managed Care – PPO | Admitting: Registered"

## 2021-11-02 DIAGNOSIS — O24111 Pre-existing diabetes mellitus, type 2, in pregnancy, first trimester: Secondary | ICD-10-CM | POA: Diagnosis present

## 2021-11-02 DIAGNOSIS — Z713 Dietary counseling and surveillance: Secondary | ICD-10-CM | POA: Diagnosis not present

## 2021-11-02 DIAGNOSIS — O24112 Pre-existing diabetes mellitus, type 2, in pregnancy, second trimester: Secondary | ICD-10-CM | POA: Insufficient documentation

## 2021-11-02 DIAGNOSIS — O24119 Pre-existing diabetes mellitus, type 2, in pregnancy, unspecified trimester: Secondary | ICD-10-CM | POA: Insufficient documentation

## 2021-11-02 NOTE — Progress Notes (Signed)
Patient was seen for T2D in pregnancy self-management on 11/02/2021  Start time 1415 and End time 1515   Estimated due date: 04/23/2022; [redacted]w[redacted]d  Clinical: Medications: metformin 500 mg (1000 mg bid) Medical History: GDM with 1st preg Labs: OGTT n/a, A1c 8.9% (8 mo ago was 13%)  Dietary and Lifestyle History: Pt states she was told not to eat some of the foods that would increase blood sugar such as milk, juice, rice, etc.  Pt states she has been mostly following this advise and reports minimal carb intake. Pt states she is taking 1000 mg metformin bid to see if she would be able to control without insulin.  Because Pt will likely need insulin to control blood sugar, demonstrated how to use insulin pens. Today's visit did not cover everything that would be covered in an insulin start visit. Recommend patient return for insulin instruction visit if she is prescribed insulin at next MD visit.  Patient is likely not getting adequate calcium intake or water intake. Encouraged patient start including milk again but not more than 8 oz at a time. Patient was also encouraged to drink more water.  Physical Activity: pt states she has increased physical activity Stress: not assessed Sleep: not assessed  24 hr Recall:  First Meal: salmon, broccoli Snack: Second meal: (small amount) plantain, fish Snack: Third meal: none Snack: Beverages: water, 1-3 bottles.  NUTRITION INTERVENTION  Nutrition education (E-1) on the following topics:   Initial Follow-up  []  []  Definition of Gestational Diabetes []  []  Why dietary management is important in controlling blood glucose [x]  []  Effects each nutrient has on blood glucose levels []  []  Simple carbohydrates vs complex carbohydrates [x]  []  Fluid intake [x]  []  Creating a balanced meal plan []  []  Carbohydrate counting  [x]  []  When to check blood glucose levels [x]  []  Proper blood glucose monitoring techniques []  []  Effect of stress and stress reduction  techniques  []  []  Exercise effect on blood glucose levels, appropriate exercise during pregnancy []  []  Importance of limiting caffeine and abstaining from alcohol and smoking [x]  []  Medications used for blood sugar control during pregnancy []  []  Hypoglycemia and rule of 15 []  []  Postpartum self care  Patient has a meter prior to visit. Patient is testing pre breakfast and 2 hours after each meal. Pt reports: FBS: 95-115 mg/dL Postprandial: 130-150 mg/dL  Patient instructed to monitor glucose levels: FBS: 60 - ? 95 mg/dL 1 hour: ? 140 mg/dL 2 hour: ? 120 mg/dL  Patient received handouts: Nutrition Diabetes and Pregnancy Carbohydrate Counting List  Patient will be seen for follow-up as needed.

## 2021-11-09 ENCOUNTER — Ambulatory Visit (INDEPENDENT_AMBULATORY_CARE_PROVIDER_SITE_OTHER): Payer: BC Managed Care – PPO | Admitting: Obstetrics and Gynecology

## 2021-11-09 VITALS — BP 109/74 | HR 87 | Wt 156.0 lb

## 2021-11-09 DIAGNOSIS — O24112 Pre-existing diabetes mellitus, type 2, in pregnancy, second trimester: Secondary | ICD-10-CM

## 2021-11-09 DIAGNOSIS — O34219 Maternal care for unspecified type scar from previous cesarean delivery: Secondary | ICD-10-CM

## 2021-11-09 DIAGNOSIS — E119 Type 2 diabetes mellitus without complications: Secondary | ICD-10-CM

## 2021-11-09 DIAGNOSIS — R8271 Bacteriuria: Secondary | ICD-10-CM

## 2021-11-09 DIAGNOSIS — Z3A16 16 weeks gestation of pregnancy: Secondary | ICD-10-CM

## 2021-11-09 DIAGNOSIS — O24111 Pre-existing diabetes mellitus, type 2, in pregnancy, first trimester: Secondary | ICD-10-CM

## 2021-11-09 DIAGNOSIS — Z789 Other specified health status: Secondary | ICD-10-CM

## 2021-11-09 MED ORDER — PREPLUS 27-1 MG PO TABS: 1.0000 | ORAL_TABLET | Freq: Every day | ORAL | 2 refills | Status: AC

## 2021-11-09 NOTE — Progress Notes (Signed)
   PRENATAL VISIT NOTE  Subjective:  Jessica Patel is a 41 y.o. G3P2002 at [redacted]w[redacted]d being seen today for ongoing prenatal care.  She is currently monitored for the following issues for this high-risk pregnancy and has Type 2 diabetes mellitus without complication, without long-term current use of insulin ([place]); Mixed hyperlipidemia; Language barrier; Encounter for supervision of high-risk pregnancy with multigravida of advanced maternal age; Previous cesarean section complicating pregnancy; Group B streptococcal bacteriuria; and Pre-existing type 2 diabetes mellitus in pregnancy in first trimester on their problem list.  Patient reports no complaints.  Contractions: Not present. Vag. Bleeding: None.  Movement: Present. Denies leaking of fluid.   The following portions of the patient's history were reviewed and updated as appropriate: allergies, current medications, past family history, past medical history, past social history, past surgical history and problem list.   Objective:   Vitals:   11/09/21 1314  BP: 109/74  Pulse: 87  Weight: 156 lb (70.8 kg)    Fetal Status: Fetal Heart Rate (bpm): 140   Movement: Present     General:  Alert, oriented and cooperative. Patient is in no acute distress.  Skin: Skin is warm and dry. No rash noted.   Cardiovascular: Normal heart rate noted  Respiratory: Normal respiratory effort, no problems with respiration noted  Abdomen: Soft, gravid, appropriate for gestational age.  Pain/Pressure: Absent     Pelvic: Cervical exam deferred        Extremities: Normal range of motion.  Edema: None  Mental Status: Normal mood and affect. Normal behavior. Normal judgment and thought content.   Assessment and Plan:  Pregnancy: G3P2002 at [redacted]w[redacted]d 1. Pre-existing type 2 diabetes mellitus in pregnancy in first trimester Confirms on metformin 1000/1000. Normal CBG log. A few in the low 120s 2h post prandial breakfast and lunch. Patient amenable to  afp She states she hasn't heard about optho referral. [facility] office to follow up D/w her re: fetal echo on 11/16 with [facility] - AFP, Serum, Open Spina Bifida  2. Group B streptococcal bacteriuria Too low to treat before labor  3. Previous cesarean section complicating pregnancy D/w pt re: delivery mode later in pregnancy  4. Type 2 diabetes mellitus without complication, without long-term current use of insulin ([place])  5. Language barrier Interpreter used  6. Pregnancy F/u MFM anatomy u/s. Confirms on low dose ASA  Preterm labor symptoms and general obstetric precautions including but not limited to vaginal bleeding, contractions, leaking of fluid and fetal movement were reviewed in detail with the patient. Please refer to After Visit Summary for other counseling recommendations.   No follow-ups on file.  Future Appointments  Date Time Provider Tees Toh  11/30/2021  1:00 PM [facility] US1 [facility]  11/30/2021  2:00 PM [facility] CONSULT RM [facility] None  12/07/2021  1:50 PM Anyanwu, Sallyanne Havers, MD [facility]  01/04/2022  1:30 PM Radene Gunning, MD [facility]    Aletha Halim, MD

## 2021-11-09 NOTE — Progress Notes (Signed)
ROB [redacted]w[redacted]d  CC: None

## 2021-11-11 LAB — AFP, SERUM, OPEN SPINA BIFIDA
AFP MoM: 1.02
AFP Value: 37 ng/mL
Gest. Age on Collection Date: 16 weeks
Maternal Age At EDD: 41.1 yr
OSBR Risk 1 IN: 10000
Test Results:: NEGATIVE
Weight: 156 [lb_av]

## 2021-11-30 ENCOUNTER — Ambulatory Visit (HOSPITAL_BASED_OUTPATIENT_CLINIC_OR_DEPARTMENT_OTHER): Payer: BC Managed Care – PPO | Admitting: Obstetrics

## 2021-11-30 ENCOUNTER — Ambulatory Visit: Payer: BC Managed Care – PPO | Attending: Obstetrics

## 2021-11-30 ENCOUNTER — Other Ambulatory Visit: Payer: Self-pay

## 2021-11-30 DIAGNOSIS — O34219 Maternal care for unspecified type scar from previous cesarean delivery: Secondary | ICD-10-CM

## 2021-11-30 DIAGNOSIS — Z7984 Long term (current) use of oral hypoglycemic drugs: Secondary | ICD-10-CM

## 2021-11-30 DIAGNOSIS — Z363 Encounter for antenatal screening for malformations: Secondary | ICD-10-CM | POA: Insufficient documentation

## 2021-11-30 DIAGNOSIS — O24111 Pre-existing diabetes mellitus, type 2, in pregnancy, first trimester: Secondary | ICD-10-CM

## 2021-11-30 DIAGNOSIS — E119 Type 2 diabetes mellitus without complications: Secondary | ICD-10-CM

## 2021-11-30 DIAGNOSIS — O09529 Supervision of elderly multigravida, unspecified trimester: Secondary | ICD-10-CM

## 2021-11-30 DIAGNOSIS — Z3A19 19 weeks gestation of pregnancy: Secondary | ICD-10-CM | POA: Insufficient documentation

## 2021-11-30 DIAGNOSIS — Z98891 History of uterine scar from previous surgery: Secondary | ICD-10-CM

## 2021-11-30 DIAGNOSIS — O09522 Supervision of elderly multigravida, second trimester: Secondary | ICD-10-CM | POA: Insufficient documentation

## 2021-11-30 DIAGNOSIS — O24112 Pre-existing diabetes mellitus, type 2, in pregnancy, second trimester: Secondary | ICD-10-CM | POA: Diagnosis not present

## 2021-11-30 NOTE — Progress Notes (Signed)
MFM Note  Jessica Patel is a 41 y.o. [id number] who was seen for a detailed fetal anatomy scan due to advanced maternal age (41 years old) and pregestational diabetes that is treated with metformin.  The patient reports that she was diagnosed with type 2 diabetes last year.    Her most recent hemoglobin A1c was 8.9%.  Her baseline PIH labs were all within normal limits.  Her baseline P/C ratio was 136.  She was just started on metformin last month.  She denies any problems in her current pregnancy.    She had a cell free DNA test earlier in her pregnancy which indicated a low risk for trisomy 19, 18, and 13. A female fetus is predicted.   She was informed that the fetal growth and amniotic fluid level were appropriate for her gestational age.   There were no obvious fetal anomalies noted on today's ultrasound exam.  However, today's exam was limited due to the fetal position.  The patient was informed that anomalies may be missed due to technical limitations. If the fetus is in a suboptimal position or maternal habitus is increased, visualization of the fetus in the maternal uterus may be impaired.  The following were discussed during today's consultation:  Pregestational diabetes and pregnancy  The implications and management of diabetes in pregnancy was discussed in detail with the patient.    She was advised to continue to monitor her fingersticks 4 times daily (fasting and 2 hours after each meal).    She was advised that our goals for her fingerstick values are fasting values of 90-95 or less and two-hour postprandial values of 120 or less.    Should the majority of her fingerstick results be above these values, her metformin dose may have to be increased or she may have to be switched to insulin to help her achieve better glycemic control.   The patient was advised that getting her fingerstick values as close to these goals as possible would provide her with the  most optimal obstetrical outcome.  The increased risks of fetal congenital malformations such as heart defects and other abnormalities associated with diabetes was discussed.    She already has a fetal echocardiogram scheduled with [facility] pediatric cardiology on December 16, 2021.   We will continue to follow her with growth ultrasounds throughout her pregnancy.    Weekly fetal testing using either nonstress tests or biophysical profile should be started at around 32 weeks.    The increased risk of polyhydramnios, fetal macrosomia, and preeclampsia associated with diabetes was also discussed.    The patient was advised that delivery for well-controlled diabetes in pregnancy is usually recommended at around 39 weeks.    Delivery at 37 weeks may be considered should her glycemic control be poor.  She should continue taking a daily baby aspirin for preeclampsia prophylaxis for the duration of her pregnancy.  Advanced maternal age in pregnancy  The increased risk of fetal aneuploidy due to advanced maternal age was discussed. Due to advanced maternal age, the patient was offered and declined an amniocentesis today for definitive diagnosis of fetal aneuploidy.  She is comfortable with her negative cell free DNA test.  A follow-up growth scan was scheduled in 4 weeks.  The patient stated that all of her questions have been answered to her satisfaction.    All conversations were held with the patient today with the help of a [place] interpreter.  A total of 45 minutes was spent  counseling and coordinating the care for this patient.  Greater than 50% of the time was spent in direct face-to-face contact.

## 2021-12-07 ENCOUNTER — Encounter: Payer: Self-pay | Admitting: Obstetrics & Gynecology

## 2021-12-07 ENCOUNTER — Ambulatory Visit (INDEPENDENT_AMBULATORY_CARE_PROVIDER_SITE_OTHER): Payer: BC Managed Care – PPO | Admitting: Obstetrics & Gynecology

## 2021-12-07 VITALS — BP 120/76 | HR 88 | Wt 157.0 lb

## 2021-12-07 DIAGNOSIS — O09522 Supervision of elderly multigravida, second trimester: Secondary | ICD-10-CM

## 2021-12-07 DIAGNOSIS — O34219 Maternal care for unspecified type scar from previous cesarean delivery: Secondary | ICD-10-CM

## 2021-12-07 DIAGNOSIS — O24112 Pre-existing diabetes mellitus, type 2, in pregnancy, second trimester: Secondary | ICD-10-CM

## 2021-12-07 DIAGNOSIS — O09529 Supervision of elderly multigravida, unspecified trimester: Secondary | ICD-10-CM

## 2021-12-07 DIAGNOSIS — Z3A20 20 weeks gestation of pregnancy: Secondary | ICD-10-CM

## 2021-12-07 MED ORDER — METFORMIN HCL 1000 MG PO TABS: 1000.0000 mg | ORAL_TABLET | Freq: Two times a day (BID) | ORAL | 2 refills | Status: DC

## 2021-12-07 MED ORDER — ASPIRIN 81 MG PO TBEC: 81.0000 mg | DELAYED_RELEASE_TABLET | Freq: Every day | ORAL | 2 refills | Status: DC

## 2021-12-07 NOTE — Progress Notes (Signed)
PRENATAL VISIT NOTE  Subjective:  Jessica Patel is a 41 y.o. G3P2002 at [redacted]w[redacted]d being seen today for ongoing prenatal care. [place] interpreter used for this encounter.  She is currently monitored for the following issues for this high-risk pregnancy and has Type 2 diabetes mellitus without complication, without long-term current use of insulin ([place]); Mixed hyperlipidemia; Language barrier; Encounter for supervision of high-risk pregnancy with multigravida of advanced maternal age; Previous cesarean section complicating pregnancy; Group B streptococcal bacteriuria; and Pre-existing type 2 diabetes mellitus in pregnancy in second trimester on their problem list.  Patient reports no complaints.  Contractions: Not present. Vag. Bleeding: None.  Movement: Present. Denies leaking of fluid.   The following portions of the patient's history were reviewed and updated as appropriate: allergies, current medications, past family history, past medical history, past social history, past surgical history and problem list.   Objective:   Vitals:   12/07/21 1326  BP: 120/76  Pulse: 88  Weight: 157 lb (71.2 kg)    Fetal Status: Fetal Heart Rate (bpm): 143   Movement: Present     General:  Alert, oriented and cooperative. Patient is in no acute distress.  Skin: Skin is warm and dry. No rash noted.   Cardiovascular: Normal heart rate noted  Respiratory: Normal respiratory effort, no problems with respiration noted  Abdomen: Soft, gravid, appropriate for gestational age.  Pain/Pressure: Present     Pelvic: Cervical exam deferred        Extremities: Normal range of motion.  Edema: None  Mental Status: Normal mood and affect. Normal behavior. Normal judgment and thought content.   Imaging: [place] MFM OB DETAIL +14 WK  Result Date: 11/30/2021 ----------------------------------------------------------------------  OBSTETRICS REPORT                       (Signed Final 11/30/2021  02:20 pm) ---------------------------------------------------------------------- Patient Info  ID #:       [id number]                          D.O.B.:  1980/12/14 (41 yrs)  Name:       Jessica Patel                  Visit Date: 11/30/2021 12:56 pm              CHARLES SAINTERMOND ---------------------------------------------------------------------- Performed By  Attending:        Johnell Comings MD         Ref. Address:     [place]  Performed By:     Germain Osgood            [facility]                                     [facility]  Referred By:      [facility] ---------------------------------------------------------------------- Orders  #  Description                           Code        Ordered By  1  [place] MFM OB DETAIL +14 [place]               [id number]    Verita Schneiders ----------------------------------------------------------------------  #  Order #                     Accession #                Episode #  1  [id number]                   [id number]                 [id number] ---------------------------------------------------------------------- Indications  Pre-existing diabetes, type 2, in pregnancy,   O24.112  second trimester (metformin)  Advanced maternal age multigravida 87+,        O49.522  second trimester (41 yo)  History of cesarean delivery, currently        O48.219  pregnant  [redacted] weeks gestation of pregnancy                Z3A.19  Encounter for antenatal screening for          Z36.3  malformations  LR NIPS ---------------------------------------------------------------------- Fetal Evaluation  Num Of Fetuses:         1  Fetal Heart Rate(bpm):  142  Cardiac Activity:       Observed  Presentation:           Cephalic  Placenta:               Posterior  P. Cord Insertion:      Visualized, central  Amniotic  Fluid  AFI FV:      Within normal limits                              Largest Pocket(cm)                              6.22 ---------------------------------------------------------------------- Biometry  BPD:     43.88  mm     G. Age:  19w 2d         43  %    CI:        72.88   %    70 - 86                                                          FL/HC:      17.0   %    16.1 - 18.3  HC:    163.42   mm     G. Age:  19w 1d         27  %    HC/AC:      1.16        1.09 - 1.39  AC:    140.69   mm     G.  Age:  19w 3d         63  %    FL/BPD:     63.4   %  FL:      27.82  mm     G. Age:  18w 4d         14  %    FL/AC:      19.8   %    20 - 24  HUM:      25.9  mm     G. Age:  18w 1d         17  %  CER:      19.4  mm     G. Age:  18w 6d         33  %  NFT:       4.8  mm  LV:        5.1  mm  CM:        3.4  mm  Est. FW:     270  gm    0 lb 10 oz      24  % ---------------------------------------------------------------------- OB History  Gravidity:    3         Term:   2        Prem:   0        SAB:   0  TOP:          0       Ectopic:  0        Living: 2 ---------------------------------------------------------------------- Gestational Age  LMP:           19w 3d        Date:  07/17/21                  EDD:   04/23/22  U/S Today:     19w 1d                                        EDD:   04/25/22  Best:          19w 3d     Det. By:  LMP  (07/17/21)          EDD:   04/23/22 ---------------------------------------------------------------------- Anatomy  Cranium:               Appears normal         Aortic Arch:            Appears normal  Cavum:                 Not well visualized    Ductal Arch:            Not well visualized  Ventricles:            Appears normal         Diaphragm:              Appears normal  Choroid Plexus:        Appears normal         Stomach:                Appears normal, left  sided  Cerebellum:            Appears normal         Abdomen:                 Appears normal  Posterior Fossa:       Appears normal         Abdominal Wall:         Appears nml (cord                                                                        insert, abd wall)  Nuchal Fold:           Appears normal         Cord Vessels:           Appears normal (3                                                                        vessel cord)  Face:                  Appears normal         Kidneys:                Appear normal                         (orbits and profile)  Lips:                  Appears normal         Bladder:                Appears normal  Thoracic:              Appears normal         Spine:                  Appears normal  Heart:                 Appears normal         Upper Extremities:      Appears normal                         (4CH, axis, and                         situs)  RVOT:                  Not well visualized    Lower Extremities:      Appears normal  LVOT:                  Appears normal  Other:  Heels/feet and open hands/5th digits visualized. Nasal bone, lenses,          maxilla, mandible and falx visualized. VC, 3VV and 3VTV  visualized.          Fetus appears to be female. Technically difficult due to fetal position. ---------------------------------------------------------------------- Cervix Uterus Adnexa  Cervix  Length:            5.1  cm.  Normal appearance by transabdominal scan.  Uterus  No abnormality visualized.  Right Ovary  Not visualized.  Left Ovary  Size(cm)     2.79   x   1.45   x  1.4       Vol(ml): 2.97  Within normal limits.  Cul De Sac  No free fluid seen.  Adnexa  No adnexal mass visualized. ---------------------------------------------------------------------- Comments  Mannie Stabile is a 41 y.o. [phone]  who was seen for a detailed fetal anatomy scan due to  advanced maternal age (41 years old) and pregestational  diabetes that is treated with metformin.  The patient reports  that she was diagnosed with type 2  diabetes last year.  Her most recent hemoglobin A1c was 8.9%.  Her baseline  PIH labs were all within normal limits.  Her baseline P/C ratio  was 136.  She was just started on metformin last month.  She denies any problems in her current pregnancy.  She had a cell free DNA test earlier in her pregnancy which  indicated a low risk for trisomy 48, 55, and 13. A female fetus  is predicted.  She was informed that the fetal growth and amniotic fluid  level were appropriate for her gestational age.  There were no obvious fetal anomalies noted on today's  ultrasound exam.  However, today's exam was limited due to  the fetal position.  The patient was informed that anomalies may be missed due  to technical limitations. If the fetus is in a suboptimal position  or maternal habitus is increased, visualization of the fetus in  the maternal uterus may be impaired.  The following were discussed during today's consultation:  Pregestational diabetes and pregnancy  The implications and management of diabetes in pregnancy  was discussed in detail with the patient.  She was advised to continue to monitor her fingersticks 4  times daily (fasting and 2 hours after each meal).  She was advised that our goals for her fingerstick values are  fasting values of 90-95 or less and two-hour postprandial  values of 120 or less.  Should the majority of her fingerstick results be above these  values, her metformin dose may have to be increased or she  may have to be switched to insulin to help her achieve better  glycemic control.  The patient was advised that getting her fingerstick values as  close to these goals as possible would provide her with the  most optimal obstetrical outcome.  The increased risks of fetal congenital malformations such as  heart defects and other abnormalities associated with  diabetes was discussed.  She already has a fetal echocardiogram scheduled with [facility]  pediatric cardiology on December 16, 2021.  We will continue  to follow her with growth ultrasounds  throughout her pregnancy.  Weekly fetal testing using either nonstress tests or  biophysical profile should be started at around 32 weeks.  The increased risk of polyhydramnios, fetal macrosomia, and  preeclampsia associated with diabetes was also discussed.  The patient was advised that delivery for well-controlled  diabetes in pregnancy is usually recommended at around 39  weeks.  Delivery at 37 weeks may be considered should her glycemic  control be poor.  She  should continue taking a daily baby aspirin for  preeclampsia prophylaxis for the duration of her pregnancy.  Advanced maternal age in pregnancy  The increased risk of fetal aneuploidy due to advanced  maternal age was discussed. Due to advanced maternal age,  the patient was offered and declined an amniocentesis today  for definitive diagnosis of fetal aneuploidy.  She is  comfortable with her negative cell free DNA test.  A follow-up growth scan was scheduled in 4 weeks.  The patient stated that all of her questions have been  answered to her satisfaction.  All conversations were held with the patient today with the  help of a [place] interpreter.  A total of 45 minutes was spent counseling and coordinating  the care for this patient.  Greater than 50% of the time was  spent in direct face-to-face contact. ----------------------------------------------------------------------                   Johnell Comings, MD Electronically Signed Final Report   11/30/2021 02:20 pm ----------------------------------------------------------------------   Assessment and Plan:  Pregnancy: [id number] at [redacted]w[redacted]d 1. Pre-existing type 2 diabetes mellitus in pregnancy in second trimester CBGs reviewed, managed on Metformin 1000 mg po bid for now. This was refilled. ASA also refilled.  Continue scans as per MFM. Fetal ECHO schedule. Optho exam referral done. - metFORMIN (GLUCOPHAGE) 1000 MG tablet; Take 1 tablet (1,000 mg total) by  mouth 2 (two) times daily with a meal.  Dispense: 60 tablet; Refill: 2 - aspirin EC 81 MG tablet; Take 1 tablet (81 mg total) by mouth daily. Start taking when you are [redacted] weeks pregnant for rest of pregnancy for prevention of preeclampsia  Dispense: 300 tablet; Refill: 2  2. [redacted] weeks gestation of pregnancy 3. Encounter for supervision of high-risk pregnancy with multigravida of advanced maternal age LR NIPS, AFP negative.  Preterm labor symptoms and general obstetric precautions including but not limited to vaginal bleeding, contractions, leaking of fluid and fetal movement were reviewed in detail with the patient. Please refer to After Visit Summary for other counseling recommendations.   Return in about 4 weeks (around 01/04/2022) for OFFICE OB VISIT (MD only).  Future Appointments  Date Time Provider [place]  01/04/2022 10:00 AM [facility]  01/04/2022  1:30 PM Radene Gunning, MD [facility]    Verita Schneiders, MD

## 2021-12-30 ENCOUNTER — Other Ambulatory Visit: Payer: Self-pay

## 2021-12-30 DIAGNOSIS — O09522 Supervision of elderly multigravida, second trimester: Secondary | ICD-10-CM

## 2021-12-30 DIAGNOSIS — Z98891 History of uterine scar from previous surgery: Secondary | ICD-10-CM

## 2021-12-30 DIAGNOSIS — O24112 Pre-existing diabetes mellitus, type 2, in pregnancy, second trimester: Secondary | ICD-10-CM

## 2021-12-31 NOTE — Progress Notes (Unsigned)
   PRENATAL VISIT NOTE  Subjective:  Jessica Patel is a 41 y.o. G3P2002 at [redacted]w[redacted]d being seen today for ongoing prenatal care.  She is currently monitored for the following issues for this high-risk pregnancy and has Type 2 diabetes mellitus without complication, without long-term current use of insulin ([facility]); Mixed hyperlipidemia; Language barrier; Encounter for supervision of high-risk pregnancy with multigravida of advanced maternal age; Previous cesarean section complicating pregnancy; Group B streptococcal bacteriuria; and Pre-existing type 2 diabetes mellitus in pregnancy in second trimester on their problem list.  Patient reports no complaints.  Contractions: Not present. Vag. Bleeding: None.  Movement: Present. Denies leaking of fluid.   The following portions of the patient's history were reviewed and updated as appropriate: allergies, current medications, past family history, past medical history, past social history, past surgical history and problem list.   Objective:   Vitals:   01/04/22 1311  BP: 108/72  Pulse: 82  Weight: 158 lb (71.7 kg)    Fetal Status: Fetal Heart Rate (bpm): 154   Movement: Present     General:  Alert, oriented and cooperative. Patient is in no acute distress.  Skin: Skin is warm and dry. No rash noted.   Cardiovascular: Normal heart rate noted  Respiratory: Normal respiratory effort, no problems with respiration noted  Abdomen: Soft, gravid, appropriate for gestational age.  Pain/Pressure: Absent     Pelvic: Cervical exam deferred        Extremities: Normal range of motion.  Edema: None  Mental Status: Normal mood and affect. Normal behavior. Normal judgment and thought content.   Assessment and Plan:  Pregnancy: G3P2002 at [redacted]w[redacted]d 1. Pre-existing type 2 diabetes mellitus in pregnancy in second trimester CBGs reviewed: see photo below. Continue current regimen.  Current regimen is MTF 1000 bid.  Had growth today - normal 25%ile,  normal AFI, cephalic. AC wnl. Continue serial growth.    2. Language barrier Interpreter used throughout.   3. Encounter for supervision of high-risk pregnancy with multigravida of advanced maternal age NIPS lr female   4. Previous cesarean section complicating pregnancy Desires TOLAC - sign consent at next appt. She does have h/o G1 SVD. Reviewed birth history in more depth:  G1: SVD, 6lb4oz G2: Scheduled cesarean without labor due to anticipated fetal size - baby was OP and 9lb7oz.  Sign consent next time.   5. Group B streptococcal bacteriuria PCN in labor  Preterm labor symptoms and general obstetric precautions including but not limited to vaginal bleeding, contractions, leaking of fluid and fetal movement were reviewed in detail with the patient. Please refer to After Visit Summary for other counseling recommendations.   Return in about 4 weeks (around 02/01/2022) for OB VISIT, MD only.  Future Appointments  Date Time Provider Department Center  02/01/2022  2:00 PM [facility]  02/03/2022  8:35 AM [place] Bing, MD [facility]  02/15/2022  1:30 PM [place] Bing, MD [facility]    Milas Hock, MD

## 2022-01-04 ENCOUNTER — Ambulatory Visit: Payer: BC Managed Care – PPO | Attending: Obstetrics and Gynecology

## 2022-01-04 ENCOUNTER — Ambulatory Visit (INDEPENDENT_AMBULATORY_CARE_PROVIDER_SITE_OTHER): Payer: BC Managed Care – PPO | Admitting: Obstetrics and Gynecology

## 2022-01-04 ENCOUNTER — Encounter: Payer: Self-pay | Admitting: Obstetrics and Gynecology

## 2022-01-04 ENCOUNTER — Other Ambulatory Visit: Payer: Self-pay

## 2022-01-04 VITALS — BP 108/72 | HR 82 | Wt 158.0 lb

## 2022-01-04 VITALS — BP 105/74 | HR 76 | Temp 98.0°F | Ht 65.0 in | Wt 157.0 lb

## 2022-01-04 DIAGNOSIS — Z3A24 24 weeks gestation of pregnancy: Secondary | ICD-10-CM

## 2022-01-04 DIAGNOSIS — O24112 Pre-existing diabetes mellitus, type 2, in pregnancy, second trimester: Secondary | ICD-10-CM

## 2022-01-04 DIAGNOSIS — O09529 Supervision of elderly multigravida, unspecified trimester: Secondary | ICD-10-CM

## 2022-01-04 DIAGNOSIS — O34219 Maternal care for unspecified type scar from previous cesarean delivery: Secondary | ICD-10-CM

## 2022-01-04 DIAGNOSIS — E119 Type 2 diabetes mellitus without complications: Secondary | ICD-10-CM | POA: Diagnosis not present

## 2022-01-04 DIAGNOSIS — O09522 Supervision of elderly multigravida, second trimester: Secondary | ICD-10-CM | POA: Diagnosis not present

## 2022-01-04 DIAGNOSIS — Z98891 History of uterine scar from previous surgery: Secondary | ICD-10-CM | POA: Diagnosis not present

## 2022-01-04 DIAGNOSIS — R8271 Bacteriuria: Secondary | ICD-10-CM

## 2022-01-04 DIAGNOSIS — Z362 Encounter for other antenatal screening follow-up: Secondary | ICD-10-CM

## 2022-01-04 DIAGNOSIS — Z789 Other specified health status: Secondary | ICD-10-CM

## 2022-01-04 NOTE — Progress Notes (Signed)
ROB   CC: None   Pt brought blood sugar log today.

## 2022-01-26 ENCOUNTER — Other Ambulatory Visit: Payer: Self-pay

## 2022-01-26 DIAGNOSIS — Z3A19 19 weeks gestation of pregnancy: Secondary | ICD-10-CM

## 2022-01-26 DIAGNOSIS — Z98891 History of uterine scar from previous surgery: Secondary | ICD-10-CM

## 2022-01-26 DIAGNOSIS — O09522 Supervision of elderly multigravida, second trimester: Secondary | ICD-10-CM

## 2022-01-26 DIAGNOSIS — O24112 Pre-existing diabetes mellitus, type 2, in pregnancy, second trimester: Secondary | ICD-10-CM

## 2022-01-31 NOTE — L&D Delivery Note (Signed)
Labor Course: Jessica Patel is a 42 y.o. female [phone] with IUP at 17w0dadmitted for PPROM on 03/30/22 with admission on 04/01/22.  She progressed with augmentation to complete and pushed x 2 contractions to deliver. Short umblical cord noted, so difficulty placing baby in pt arms until clamped and cut.  Cord clamping delayed by 1 minute then clamped by CNM and cut by FOB.  Placenta intact and spontaneous, bleeding minimal. Tiny abrasions of right labia only, not requiring repair.  Mom and baby stable prior to transfer to postpartum. She plans on breastfeeding. She is undecided for contraception.  Delivery Note At 10:43 AM a viable and healthy female was delivered via VBAC, Spontaneous (Presentation: Left Occiput Anterior).  APGAR: 6, 9; weight 6 lb 7.7 oz (2940 g).   Placenta status: Spontaneous, Intact.  Cord: 3 vessels with the following complications: Short.  Cord pH: 7.28  Anesthesia: Epidural Episiotomy: None Lacerations: labial, superficial tiny left labia abrasions x 2 Suture Repair:  n/a Est. Blood Loss (mL): 150  Mom to postpartum.  Baby to Couplet care / Skin to Skin.  LFatima Blank3/03/2022, 12:21 PM

## 2022-02-01 ENCOUNTER — Other Ambulatory Visit: Payer: Self-pay

## 2022-02-01 ENCOUNTER — Ambulatory Visit: Payer: BC Managed Care – PPO | Attending: Obstetrics and Gynecology

## 2022-02-01 VITALS — BP 117/78 | HR 98 | Temp 98.2°F | Ht 65.0 in | Wt 158.5 lb

## 2022-02-01 DIAGNOSIS — O24112 Pre-existing diabetes mellitus, type 2, in pregnancy, second trimester: Secondary | ICD-10-CM | POA: Diagnosis not present

## 2022-02-01 DIAGNOSIS — Z7984 Long term (current) use of oral hypoglycemic drugs: Secondary | ICD-10-CM | POA: Diagnosis not present

## 2022-02-01 DIAGNOSIS — Z3A28 28 weeks gestation of pregnancy: Secondary | ICD-10-CM

## 2022-02-01 DIAGNOSIS — Z98891 History of uterine scar from previous surgery: Secondary | ICD-10-CM

## 2022-02-01 DIAGNOSIS — E118 Type 2 diabetes mellitus with unspecified complications: Secondary | ICD-10-CM | POA: Insufficient documentation

## 2022-02-01 DIAGNOSIS — O09522 Supervision of elderly multigravida, second trimester: Secondary | ICD-10-CM | POA: Diagnosis not present

## 2022-02-01 DIAGNOSIS — O09523 Supervision of elderly multigravida, third trimester: Secondary | ICD-10-CM | POA: Insufficient documentation

## 2022-02-01 DIAGNOSIS — O24113 Pre-existing diabetes mellitus, type 2, in pregnancy, third trimester: Secondary | ICD-10-CM | POA: Diagnosis present

## 2022-02-01 DIAGNOSIS — O34219 Maternal care for unspecified type scar from previous cesarean delivery: Secondary | ICD-10-CM | POA: Diagnosis not present

## 2022-02-01 DIAGNOSIS — Z362 Encounter for other antenatal screening follow-up: Secondary | ICD-10-CM

## 2022-02-01 DIAGNOSIS — O09529 Supervision of elderly multigravida, unspecified trimester: Secondary | ICD-10-CM

## 2022-02-02 ENCOUNTER — Ambulatory Visit (INDEPENDENT_AMBULATORY_CARE_PROVIDER_SITE_OTHER): Payer: BC Managed Care – PPO | Admitting: Family Medicine

## 2022-02-02 ENCOUNTER — Encounter: Payer: Self-pay | Admitting: Family Medicine

## 2022-02-02 VITALS — BP 114/72 | HR 82 | Wt 158.0 lb

## 2022-02-02 DIAGNOSIS — O34219 Maternal care for unspecified type scar from previous cesarean delivery: Secondary | ICD-10-CM

## 2022-02-02 DIAGNOSIS — Z789 Other specified health status: Secondary | ICD-10-CM

## 2022-02-02 DIAGNOSIS — R8271 Bacteriuria: Secondary | ICD-10-CM

## 2022-02-02 DIAGNOSIS — O09523 Supervision of elderly multigravida, third trimester: Secondary | ICD-10-CM

## 2022-02-02 DIAGNOSIS — O09529 Supervision of elderly multigravida, unspecified trimester: Secondary | ICD-10-CM

## 2022-02-02 DIAGNOSIS — Z3A28 28 weeks gestation of pregnancy: Secondary | ICD-10-CM

## 2022-02-02 NOTE — Progress Notes (Signed)
   PRENATAL VISIT NOTE  Subjective:  Jessica Patel is a 42 y.o. G3P2002 at [redacted]w[redacted]d being seen today for ongoing prenatal care.  She is currently monitored for the following issues for this high-risk pregnancy and has Type 2 diabetes mellitus without complication, without long-term current use of insulin ([place]); Mixed hyperlipidemia; Language barrier; Encounter for supervision of high-risk pregnancy with multigravida of advanced maternal age; Previous cesarean section complicating pregnancy; Group B streptococcal bacteriuria; and Pre-existing type 2 diabetes mellitus during pregnancy on their problem list.  Patient reports no complaints.  Contractions: Irritability. Vag. Bleeding: None.  Movement: Present. Denies leaking of fluid.   The following portions of the patient's history were reviewed and updated as appropriate: allergies, current medications, past family history, past medical history, past social history, past surgical history and problem list.   Objective:   Vitals:   02/02/22 1415 02/02/22 1419  BP: 114/72 114/72  Pulse: 82 82  Weight: 158 lb (71.7 kg) 158 lb (71.7 kg)    Fetal Status: Fetal Heart Rate (bpm): 152 Fundal Height: 28 cm Movement: Present     General:  Alert, oriented and cooperative. Patient is in no acute distress.  Skin: Skin is warm and dry. No rash noted.   Cardiovascular: Normal heart rate noted  Respiratory: Normal respiratory effort, no problems with respiration noted  Abdomen: Soft, gravid, appropriate for gestational age.  Pain/Pressure: Present     Pelvic: Cervical exam deferred        Extremities: Normal range of motion.  Edema: None  Mental Status: Normal mood and affect. Normal behavior. Normal judgment and thought content.   Assessment and Plan:  Pregnancy: G3P2002 at [redacted]w[redacted]d 1. Encounter for supervision of high-risk pregnancy with multigravida of advanced maternal age 92 week labs today - CBC - Hemoglobin A1c - HIV Antibody  (routine testing w rflx) - RPR  2. Group B streptococcal bacteriuria Will need treatment in labor  3. Language barrier [place] interpreter: video interpreter used   4. Previous cesarean section complicating pregnancy TOLAC consent signed today  Preterm labor symptoms and general obstetric precautions including but not limited to vaginal bleeding, contractions, leaking of fluid and fetal movement were reviewed in detail with the patient. Please refer to After Visit Summary for other counseling recommendations.   Return in 2 weeks (on 02/16/2022).  Future Appointments  Date Time Provider [place]  02/17/2022  1:50 PM Aletha Halim, MD [facility]  03/01/2022  3:00 PM [facility]  03/10/2022 11:00 AM [facility]  03/17/2022 11:00 AM [facility]    Donnamae Jude, MD

## 2022-02-02 NOTE — Progress Notes (Signed)
ROB [redacted]w[redacted]d  Flu Vaccine: Offered pt declined

## 2022-02-03 ENCOUNTER — Encounter: Payer: BC Managed Care – PPO | Admitting: Obstetrics and Gynecology

## 2022-02-03 ENCOUNTER — Other Ambulatory Visit: Payer: BC Managed Care – PPO

## 2022-02-03 LAB — CBC
Hematocrit: 37.4 % (ref 34.0–46.6)
Hemoglobin: 12.3 g/dL (ref 11.1–15.9)
MCH: 29.8 pg (ref 26.6–33.0)
MCHC: 32.9 g/dL (ref 31.5–35.7)
MCV: 91 fL (ref 79–97)
Platelets: 135 10*3/uL — ABNORMAL LOW (ref 150–450)
RBC: 4.13 x10E6/uL (ref 3.77–5.28)
RDW: 12.9 % (ref 11.7–15.4)
WBC: 5.4 10*3/uL (ref 3.4–10.8)

## 2022-02-03 LAB — HEMOGLOBIN A1C
Est. average glucose Bld gHb Est-mCnc: 117 mg/dL
Hgb A1c MFr Bld: 5.7 % — ABNORMAL HIGH (ref 4.8–5.6)

## 2022-02-03 LAB — HIV ANTIBODY (ROUTINE TESTING W REFLEX): HIV Screen 4th Generation wRfx: NONREACTIVE

## 2022-02-03 LAB — RPR: RPR Ser Ql: NONREACTIVE

## 2022-02-15 ENCOUNTER — Encounter: Payer: BC Managed Care – PPO | Admitting: Obstetrics and Gynecology

## 2022-02-17 ENCOUNTER — Ambulatory Visit (INDEPENDENT_AMBULATORY_CARE_PROVIDER_SITE_OTHER): Payer: BC Managed Care – PPO | Admitting: Obstetrics and Gynecology

## 2022-02-17 VITALS — BP 106/69 | HR 84 | Wt 158.0 lb

## 2022-02-17 DIAGNOSIS — Z789 Other specified health status: Secondary | ICD-10-CM

## 2022-02-17 DIAGNOSIS — O09529 Supervision of elderly multigravida, unspecified trimester: Secondary | ICD-10-CM

## 2022-02-17 DIAGNOSIS — Z3A30 30 weeks gestation of pregnancy: Secondary | ICD-10-CM

## 2022-02-17 DIAGNOSIS — O34219 Maternal care for unspecified type scar from previous cesarean delivery: Secondary | ICD-10-CM

## 2022-02-17 DIAGNOSIS — R8271 Bacteriuria: Secondary | ICD-10-CM

## 2022-02-17 DIAGNOSIS — O24113 Pre-existing diabetes mellitus, type 2, in pregnancy, third trimester: Secondary | ICD-10-CM

## 2022-02-17 DIAGNOSIS — O09523 Supervision of elderly multigravida, third trimester: Secondary | ICD-10-CM | POA: Insufficient documentation

## 2022-02-17 NOTE — Progress Notes (Signed)
ROB [redacted]w[redacted]d  CC: None

## 2022-02-17 NOTE — Progress Notes (Signed)
   PRENATAL VISIT NOTE  Subjective:  Jessica Patel is a 42 y.o. G3P2002 at [redacted]w[redacted]d being seen today for ongoing prenatal care.  She is currently monitored for the following issues for this high-risk pregnancy and has Type 2 diabetes mellitus without complication, without long-term current use of insulin ([place]); Mixed hyperlipidemia; Language barrier; Encounter for supervision of high-risk pregnancy with multigravida of advanced maternal age; Previous cesarean section complicating pregnancy; Group B streptococcal bacteriuria; Pre-existing type 2 diabetes mellitus during pregnancy; and Multigravida of advanced maternal age in third trimester on their problem list.  Patient reports no complaints.  Contractions: Not present. Vag. Bleeding: None.  Movement: Present. Denies leaking of fluid.   The following portions of the patient's history were reviewed and updated as appropriate: allergies, current medications, past family history, past medical history, past social history, past surgical history and problem list.   Objective:   Vitals:   02/17/22 1347  BP: 106/69  Pulse: 84  Weight: 158 lb (71.7 kg)    Fetal Status: Fetal Heart Rate (bpm): 142   Movement: Present     General:  Alert, oriented and cooperative. Patient is in no acute distress.  Skin: Skin is warm and dry. No rash noted.   Cardiovascular: Normal heart rate noted  Respiratory: Normal respiratory effort, no problems with respiration noted  Abdomen: Soft, gravid, appropriate for gestational age.  Pain/Pressure: Present     Pelvic: Cervical exam deferred        Extremities: Normal range of motion.  Edema: None  Mental Status: Normal mood and affect. Normal behavior. Normal judgment and thought content.   Assessment and Plan:  Pregnancy: G3P2002 at [redacted]w[redacted]d 1. Encounter for supervision of high-risk pregnancy with multigravida of advanced maternal age No issues  2. Pre-existing type 2 diabetes mellitus during  pregnancy in third trimester Normal CBG log book on metformin 1000 bid 1/2: 30%, 1198gm, ac 36%, afi 18.7 11/16: fetal echo wnl and peds recommends postnatal newborn nursery echo Start ap testing at 32wks I d/w patient and partner re: delivery timing to be determined around 34-35wks  3. Multigravida of advanced maternal age in third trimester  4. Group B streptococcal bacteriuria Too low to treat before delivery  5. Previous cesarean section complicating pregnancy Tolac consent already signed  6. Language barrier Ipad interpreter used  7. Pregnancy F/u re: contraception next visit.   Preterm labor symptoms and general obstetric precautions including but not limited to vaginal bleeding, contractions, leaking of fluid and fetal movement were reviewed in detail with the patient. Please refer to After Visit Summary for other counseling recommendations.   No follow-ups on file.  Future Appointments  Date Time Provider [place]  03/01/2022  3:00 PM [facility]  03/07/2022  3:30 PM Aletha Halim, MD [facility]  03/10/2022 11:00 AM [facility] [place]  03/17/2022 11:00 AM [facility] [place]  03/22/2022  3:30 PM Anyanwu, Sallyanne Havers, MD [facility]    Aletha Halim, MD

## 2022-02-24 ENCOUNTER — Other Ambulatory Visit: Payer: Self-pay

## 2022-02-24 DIAGNOSIS — Z98891 History of uterine scar from previous surgery: Secondary | ICD-10-CM

## 2022-02-24 DIAGNOSIS — O24913 Unspecified diabetes mellitus in pregnancy, third trimester: Secondary | ICD-10-CM

## 2022-02-24 DIAGNOSIS — O09523 Supervision of elderly multigravida, third trimester: Secondary | ICD-10-CM

## 2022-03-01 ENCOUNTER — Other Ambulatory Visit: Payer: Self-pay

## 2022-03-01 ENCOUNTER — Ambulatory Visit: Payer: BC Managed Care – PPO | Attending: Obstetrics

## 2022-03-01 VITALS — BP 112/81 | HR 85 | Temp 97.9°F | Wt 159.5 lb

## 2022-03-01 DIAGNOSIS — O09523 Supervision of elderly multigravida, third trimester: Secondary | ICD-10-CM | POA: Diagnosis not present

## 2022-03-01 DIAGNOSIS — O34219 Maternal care for unspecified type scar from previous cesarean delivery: Secondary | ICD-10-CM

## 2022-03-01 DIAGNOSIS — E119 Type 2 diabetes mellitus without complications: Secondary | ICD-10-CM

## 2022-03-01 DIAGNOSIS — O09529 Supervision of elderly multigravida, unspecified trimester: Secondary | ICD-10-CM

## 2022-03-01 DIAGNOSIS — O24913 Unspecified diabetes mellitus in pregnancy, third trimester: Secondary | ICD-10-CM

## 2022-03-01 DIAGNOSIS — Z3A32 32 weeks gestation of pregnancy: Secondary | ICD-10-CM

## 2022-03-01 DIAGNOSIS — Z7984 Long term (current) use of oral hypoglycemic drugs: Secondary | ICD-10-CM

## 2022-03-01 DIAGNOSIS — O24113 Pre-existing diabetes mellitus, type 2, in pregnancy, third trimester: Secondary | ICD-10-CM | POA: Diagnosis not present

## 2022-03-01 DIAGNOSIS — Z98891 History of uterine scar from previous surgery: Secondary | ICD-10-CM

## 2022-03-07 ENCOUNTER — Ambulatory Visit (INDEPENDENT_AMBULATORY_CARE_PROVIDER_SITE_OTHER): Payer: BC Managed Care – PPO | Admitting: Obstetrics and Gynecology

## 2022-03-07 ENCOUNTER — Encounter: Payer: Self-pay | Admitting: Obstetrics and Gynecology

## 2022-03-07 VITALS — BP 102/67 | HR 84 | Wt 157.0 lb

## 2022-03-07 DIAGNOSIS — O09529 Supervision of elderly multigravida, unspecified trimester: Secondary | ICD-10-CM

## 2022-03-07 DIAGNOSIS — O09523 Supervision of elderly multigravida, third trimester: Secondary | ICD-10-CM

## 2022-03-07 DIAGNOSIS — Z3A33 33 weeks gestation of pregnancy: Secondary | ICD-10-CM

## 2022-03-07 NOTE — Progress Notes (Signed)
ROB [redacted]w[redacted]d  CC: None

## 2022-03-07 NOTE — Progress Notes (Signed)
   PRENATAL VISIT NOTE  Subjective:  Jessica Patel is a 42 y.o. G3P2002 at [redacted]w[redacted]d being seen today for ongoing prenatal care.  She is currently monitored for the following issues for this high-risk pregnancy and has Type 2 diabetes mellitus without complication, without long-term current use of insulin ([place]); Mixed hyperlipidemia; Language barrier; Encounter for supervision of high-risk pregnancy with multigravida of advanced maternal age; Previous cesarean section complicating pregnancy; Group B streptococcal bacteriuria; Pre-existing type 2 diabetes mellitus during pregnancy; and Multigravida of advanced maternal age in third trimester on their problem list.  Patient reports no complaints.  Contractions: Irritability. Vag. Bleeding: None.  Movement: Present. Denies leaking of fluid.   The following portions of the patient's history were reviewed and updated as appropriate: allergies, current medications, past family history, past medical history, past social history, past surgical history and problem list.   Objective:   Vitals:   03/07/22 1503  BP: 102/67  Pulse: 84  Weight: 157 lb (71.2 kg)    Fetal Status: Fetal Heart Rate (bpm): 132   Movement: Present     General:  Alert, oriented and cooperative. Patient is in no acute distress.  Skin: Skin is warm and dry. No rash noted.   Cardiovascular: Normal heart rate noted  Respiratory: Normal respiratory effort, no problems with respiration noted  Abdomen: Soft, gravid, appropriate for gestational age.  Pain/Pressure: Absent     Pelvic: Cervical exam deferred        Extremities: Normal range of motion.  Edema: None  Mental Status: Normal mood and affect. Normal behavior. Normal judgment and thought content.   Assessment and Plan:  Pregnancy: G3P2002 at [redacted]w[redacted]d 1. Encounter for supervision of high-risk pregnancy with multigravida of advanced maternal age No issues   2. Pre-existing type 2 diabetes mellitus during  pregnancy in third trimester Normal CBG log book on metformin 1000 bid. Continue weekly testing. Determine delivery timing around 35-36wks but I told them likely around 38wks 1/30: 49%, 2034g, ac 60%, afi 16, 8/8 pp 11/16: fetal echo wnl and peds recommends postnatal newborn nursery echo   3. Multigravida of advanced maternal age in third trimester   4. Group B streptococcal bacteriuria Too low to treat before delivery   5. Previous cesarean section complicating pregnancy [place] consent already signed   6. Language barrier Ipad interpreter used   7. Pregnancy Undecided on contraception.   Preterm labor symptoms and general obstetric precautions including but not limited to vaginal bleeding, contractions, leaking of fluid and fetal movement were reviewed in detail with the patient. Please refer to After Visit Summary for other counseling recommendations.   No follow-ups on file.  Future Appointments  Date Time Provider [place]  03/07/2022  3:30 PM Aletha Halim, MD [facility]  03/10/2022 11:00 AM [facility]  03/17/2022 11:00 AM [facility]  03/22/2022  3:30 PM Anyanwu, Sallyanne Havers, MD [facility]  03/29/2022  4:10 PM Aletha Halim, MD [facility]  04/06/2022  3:30 PM Donnamae Jude, MD [facility]  04/12/2022  4:10 PM Aletha Halim, MD [facility]  04/19/2022  3:30 PM Anyanwu, Sallyanne Havers, MD [facility]    Aletha Halim, MD

## 2022-03-08 ENCOUNTER — Other Ambulatory Visit: Payer: Self-pay

## 2022-03-08 DIAGNOSIS — O09523 Supervision of elderly multigravida, third trimester: Secondary | ICD-10-CM

## 2022-03-08 DIAGNOSIS — Z98891 History of uterine scar from previous surgery: Secondary | ICD-10-CM

## 2022-03-08 DIAGNOSIS — O24913 Unspecified diabetes mellitus in pregnancy, third trimester: Secondary | ICD-10-CM

## 2022-03-10 ENCOUNTER — Ambulatory Visit: Payer: BC Managed Care – PPO | Attending: Maternal & Fetal Medicine

## 2022-03-10 ENCOUNTER — Other Ambulatory Visit: Payer: Self-pay

## 2022-03-10 VITALS — BP 117/76 | HR 94 | Temp 98.0°F | Ht 64.0 in | Wt 158.5 lb

## 2022-03-10 DIAGNOSIS — Z3A33 33 weeks gestation of pregnancy: Secondary | ICD-10-CM | POA: Insufficient documentation

## 2022-03-10 DIAGNOSIS — O09523 Supervision of elderly multigravida, third trimester: Secondary | ICD-10-CM | POA: Diagnosis present

## 2022-03-10 DIAGNOSIS — O24113 Pre-existing diabetes mellitus, type 2, in pregnancy, third trimester: Secondary | ICD-10-CM | POA: Diagnosis not present

## 2022-03-10 DIAGNOSIS — Z98891 History of uterine scar from previous surgery: Secondary | ICD-10-CM

## 2022-03-10 DIAGNOSIS — E119 Type 2 diabetes mellitus without complications: Secondary | ICD-10-CM

## 2022-03-10 DIAGNOSIS — O24913 Unspecified diabetes mellitus in pregnancy, third trimester: Secondary | ICD-10-CM

## 2022-03-10 DIAGNOSIS — O34219 Maternal care for unspecified type scar from previous cesarean delivery: Secondary | ICD-10-CM | POA: Insufficient documentation

## 2022-03-10 DIAGNOSIS — Z369 Encounter for antenatal screening, unspecified: Secondary | ICD-10-CM | POA: Diagnosis not present

## 2022-03-10 DIAGNOSIS — O09529 Supervision of elderly multigravida, unspecified trimester: Secondary | ICD-10-CM

## 2022-03-10 DIAGNOSIS — Z7984 Long term (current) use of oral hypoglycemic drugs: Secondary | ICD-10-CM

## 2022-03-16 ENCOUNTER — Other Ambulatory Visit: Payer: Self-pay | Admitting: Maternal & Fetal Medicine

## 2022-03-16 DIAGNOSIS — O24913 Unspecified diabetes mellitus in pregnancy, third trimester: Secondary | ICD-10-CM

## 2022-03-17 ENCOUNTER — Other Ambulatory Visit: Payer: Self-pay

## 2022-03-17 ENCOUNTER — Ambulatory Visit: Payer: BC Managed Care – PPO | Attending: Obstetrics

## 2022-03-17 VITALS — BP 105/75 | HR 85 | Temp 98.0°F | Resp 20 | Ht 65.0 in | Wt 158.5 lb

## 2022-03-17 DIAGNOSIS — O34219 Maternal care for unspecified type scar from previous cesarean delivery: Secondary | ICD-10-CM

## 2022-03-17 DIAGNOSIS — O24113 Pre-existing diabetes mellitus, type 2, in pregnancy, third trimester: Secondary | ICD-10-CM | POA: Diagnosis present

## 2022-03-17 DIAGNOSIS — Z7984 Long term (current) use of oral hypoglycemic drugs: Secondary | ICD-10-CM

## 2022-03-17 DIAGNOSIS — Z3A34 34 weeks gestation of pregnancy: Secondary | ICD-10-CM | POA: Diagnosis not present

## 2022-03-17 DIAGNOSIS — O09523 Supervision of elderly multigravida, third trimester: Secondary | ICD-10-CM

## 2022-03-17 DIAGNOSIS — E119 Type 2 diabetes mellitus without complications: Secondary | ICD-10-CM | POA: Diagnosis not present

## 2022-03-17 DIAGNOSIS — O09529 Supervision of elderly multigravida, unspecified trimester: Secondary | ICD-10-CM

## 2022-03-17 DIAGNOSIS — O24913 Unspecified diabetes mellitus in pregnancy, third trimester: Secondary | ICD-10-CM

## 2022-03-22 ENCOUNTER — Other Ambulatory Visit (HOSPITAL_COMMUNITY)
Admission: RE | Admit: 2022-03-22 | Discharge: 2022-03-22 | Disposition: A | Discharge status: Home / Self Care | Payer: BC Managed Care – PPO | Source: Ambulatory Visit | Attending: Obstetrics & Gynecology | Admitting: Obstetrics & Gynecology

## 2022-03-22 ENCOUNTER — Ambulatory Visit (INDEPENDENT_AMBULATORY_CARE_PROVIDER_SITE_OTHER): Payer: BC Managed Care – PPO | Admitting: Obstetrics & Gynecology

## 2022-03-22 ENCOUNTER — Encounter: Payer: Self-pay | Admitting: Obstetrics & Gynecology

## 2022-03-22 ENCOUNTER — Other Ambulatory Visit: Payer: Self-pay

## 2022-03-22 VITALS — BP 110/74 | HR 89 | Wt 158.0 lb

## 2022-03-22 DIAGNOSIS — Z3A35 35 weeks gestation of pregnancy: Secondary | ICD-10-CM

## 2022-03-22 DIAGNOSIS — Z3493 Encounter for supervision of normal pregnancy, unspecified, third trimester: Secondary | ICD-10-CM | POA: Diagnosis not present

## 2022-03-22 DIAGNOSIS — Z98891 History of uterine scar from previous surgery: Secondary | ICD-10-CM

## 2022-03-22 DIAGNOSIS — R8271 Bacteriuria: Secondary | ICD-10-CM

## 2022-03-22 DIAGNOSIS — O09523 Supervision of elderly multigravida, third trimester: Secondary | ICD-10-CM

## 2022-03-22 DIAGNOSIS — O34219 Maternal care for unspecified type scar from previous cesarean delivery: Secondary | ICD-10-CM

## 2022-03-22 DIAGNOSIS — O24913 Unspecified diabetes mellitus in pregnancy, third trimester: Secondary | ICD-10-CM

## 2022-03-22 DIAGNOSIS — O24112 Pre-existing diabetes mellitus, type 2, in pregnancy, second trimester: Secondary | ICD-10-CM

## 2022-03-22 DIAGNOSIS — O24113 Pre-existing diabetes mellitus, type 2, in pregnancy, third trimester: Secondary | ICD-10-CM

## 2022-03-22 DIAGNOSIS — O09529 Supervision of elderly multigravida, unspecified trimester: Secondary | ICD-10-CM

## 2022-03-22 MED ORDER — METFORMIN HCL 1000 MG PO TABS: 1000.0000 mg | ORAL_TABLET | Freq: Two times a day (BID) | ORAL | 2 refills | Status: DC

## 2022-03-22 NOTE — Patient Instructions (Signed)
Return to office for any scheduled appointments. Call the office or go to the MAU at [place] at [facility] if: You begin to have strong, frequent contractions Your water breaks.  Sometimes it is a big gush of fluid, sometimes it is just a trickle that keeps getting your underwear wet or running down your legs You have vaginal bleeding.  It is normal to have a small amount of spotting if your cervix was checked.  You do not feel your baby moving like normal.  If you do not, get something to eat and drink and lay down and focus on feeling your baby move.   If your baby is still not moving like normal, you should call the office or go to MAU. Any other obstetric concerns.

## 2022-03-22 NOTE — Progress Notes (Signed)
PRENATAL VISIT NOTE  Subjective:  Jessica Patel is a 42 y.o. G3P2002 at 61w3dbeing seen today for ongoing prenatal care.  She is currently monitored for the following issues for this high-risk pregnancy and has Type 2 diabetes mellitus without complication, without long-term [place] use of [place]; Mixed hyperlipidemia; Language barrier; Encounter for supervision of high-risk pregnancy with multigravida of advanced maternal age; Previous cesarean section complicating pregnancy; Group B streptococcal bacteriuria; Pre-existing type 2 diabetes mellitus during pregnancy; and Multigravida of advanced maternal age in third trimester on their problem list.  Patient reports no complaints.  Contractions: Irregular. Vag. Bleeding: None.  Movement: Present. Denies leaking of fluid.   The following portions of the patient's history were reviewed and updated as appropriate: allergies, current medications, past family history, past medical history, past social history, past surgical history and problem list.   Objective:   Vitals:   03/22/22 1522  BP: 110/74  Pulse: 89  Weight: 158 lb (71.7 kg)    Fetal Status: Fetal Heart Rate (bpm): 153   Movement: Present     General:  Alert, oriented and cooperative. Patient is in no acute distress.  Skin: Skin is warm and dry. No rash noted.   Cardiovascular: Normal heart rate noted  Respiratory: Normal respiratory effort, no problems with respiration noted  Abdomen: Soft, gravid, appropriate for gestational age.  Pain/Pressure: Absent     Pelvic: Cervical exam deferred        Extremities: Normal range of motion.  Edema: None  Mental Status: Normal mood and affect. Normal behavior. Normal judgment and thought content.   [place] FETAL BPP WO NON STRESS  Result Date: 03/17/2022 ----------------------------------------------------------------------  OBSTETRICS REPORT                       (Signed Final 03/17/2022 12:06 pm)  ---------------------------------------------------------------------- Patient Info  ID #:       [id number]                         D.O.B.:  012/22/1982(42 yrs)  Name:       Jessica Patel                 Visit Date: 03/17/2022 09:27 am              CRaelyn Number---------------------------------------------------------------------- Performed By  Attending:        VJohnell ComingsMD         Ref. Address:     [address]                                                            [place] Performed By:     CRodrigo RanBS      [facility]  RD[facility] RVT                                 Fetal Care at                                                             [facility]  Referred By:      Osborne Oman MD ---------------------------------------------------------------------- Orders  #  Description                           Code        Ordered By  1  [place] MFM FETAL BPP WO NON               76819.01    CORENTHIAN     STRESS                                            BOOKER ----------------------------------------------------------------------  #  Order #                     Accession #                Episode #  1  [id number]                   [id number]                 [id number] ---------------------------------------------------------------------- Indications  Pre-existing diabetes, type 2, in pregnancy,   O24.113  third trimester (metformin)  Advanced maternal age multigravida 54+,        O16.523  third trimester  [redacted] weeks gestation of pregnancy                Z3A.34  History of cesarean delivery, currently        O34.219  pregnant  LR NIPS ---------------------------------------------------------------------- Fetal Evaluation  Num Of Fetuses:         1  Cardiac Activity:       Observed  Presentation:           Cephalic  Placenta:               Posterior Left  P. Cord Insertion:       Previously visualized  Amniotic Fluid  AFI FV:      Within normal limits  AFI Sum(cm)     %Tile       Largest Pocket(cm)  18.03           67          5.57  RUQ(cm)       RLQ(cm)       LUQ(cm)        LLQ(cm)  5.57          3.71          5.13           3.62 ---------------------------------------------------------------------- Biophysical Evaluation  Amniotic F.V:   Within normal limits  F. Tone:        Observed  F. Movement:    Observed                   Score:          8/8  F. Breathing:   Observed ---------------------------------------------------------------------- Biometry  LV:        1.8  mm ---------------------------------------------------------------------- OB History  Gravidity:    3         Term:   2        Prem:   0        SAB:   0  TOP:          0       Ectopic:  0        Living: 2 ---------------------------------------------------------------------- Gestational Age  LMP:           34w 5d        Date:  07/17/21                  EDD:   04/23/22  Best:          34w 5d     Det. By:  LMP  (07/17/21)          EDD:   04/23/22 ---------------------------------------------------------------------- Anatomy  Cranium:               Appears normal         Stomach:                Appears normal, left                                                                        sided  Ventricles:            Appears normal         Kidneys:                Appear normal  Heart:                 Appears normal         Bladder:                Appears normal                         (4CH, axis, and                         situs) ---------------------------------------------------------------------- Cervix Uterus Adnexa  Cervix  Not visualized (advanced GA >24wks)  Uterus  No abnormality visualized.  Right Ovary  Not visualized.  Left Ovary  Not visualized.  Cul De Sac  No free fluid seen.  Adnexa  No abnormality visualized ---------------------------------------------------------------------- Comments  This patient was seen  for a BPP due to pregestational  diabetes treated with metformin.  She denies any problems  since her last exam and reports good glycemic control.  There was normal amniotic fluid noted on today's exam.  A BPP performed today was 8 out of 8.  She will return in 1 week for another BPP. ----------------------------------------------------------------------  Johnell Comings, MD Electronically Signed Final Report   03/17/2022 12:06 pm ----------------------------------------------------------------------  [place] MFM FETAL BPP WO NON STRESS  Result Date: 03/10/2022 ----------------------------------------------------------------------  OBSTETRICS REPORT                       (Signed Final 03/10/2022 11:55 am) ---------------------------------------------------------------------- Patient Info  ID #:       [id number]                          D.O.B.:  1981-01-26 (42 yrs)  Name:       LATAUSHA RACHELLE                  Visit Date: 03/10/2022 11:07 am              CHARLES SAINTERMOND ---------------------------------------------------------------------- Performed By  Attending:        Sander Nephew      Ref. Address:     [address]                    [place]                                                             [place], [place]  Performed By:     Rodrigo Ran BS      Location[facility]         [facility]                    RDMS RVT                                 Fetal Care at                                                             [facility]  Referred By:      Osborne Oman MD ---------------------------------------------------------------------- Orders  #  Description                           Code        Ordered By  1  [place] MFM FETAL BPP WO NON               [id number]    CORENTHIAN     STRESS  BOOKER ----------------------------------------------------------------------  #   Order #                     Accession #                Episode #  1  [id number]                   [id number]                 [id number] ---------------------------------------------------------------------- Indications  Pre-existing diabetes, type 2, in pregnancy,   O24.113  third trimester (metformin)  Advanced maternal age multigravida 64+,        O55.523  third trimester  History of cesarean delivery, currently        O39.219  pregnant  [redacted] weeks gestation of pregnancy                Z3A.33  LR NIPS ---------------------------------------------------------------------- Fetal Evaluation  Num Of Fetuses:         1  Fetal Heart Rate(bpm):  119  Cardiac Activity:       Observed  Presentation:           Cephalic  Placenta:               Posterior  P. Cord Insertion:      Visualized  Amniotic Fluid  AFI FV:      Within normal limits  AFI Sum(cm)     %Tile       Largest Pocket(cm)  13.15           42          5.66  RUQ(cm)       RLQ(cm)       LUQ(cm)        LLQ(cm)  1.99          2.74          5.66           2.76 ---------------------------------------------------------------------- Biophysical Evaluation  Amniotic F.V:   Within normal limits       F. Tone:        Observed  F. Movement:    Observed                   Score:          8/8  F. Breathing:   Observed ---------------------------------------------------------------------- OB History  Gravidity:    3         Term:   2        Prem:   0        SAB:   0  TOP:          0       Ectopic:  0        Living: 2 ---------------------------------------------------------------------- Gestational Age  LMP:           33w 5d        Date:  07/17/21                  EDD:   04/23/22  Best:          33w 5d     Det. By:  LMP  (07/17/21)          EDD:   04/23/22 ---------------------------------------------------------------------- Anatomy  Heart:                 Appears normal         Stomach:  Appears normal, left                         (4CH, axis, and                                 sided                         situs)  LVOT:                  Appears normal         Kidneys:                Appear normal  Diaphragm:             Appears normal         Bladder:                Appears normal ---------------------------------------------------------------------- Cervix Uterus Adnexa  Cervix  Not visualized (advanced GA >24wks) ---------------------------------------------------------------------- Impression  Antenatal testing performed given maternal T2DM  The biophysical profile was 8/8 with good fetal movement and  amniotic fluid volume. ---------------------------------------------------------------------- Recommendations  Continue weekly testing. ----------------------------------------------------------------------              Sander Nephew, MD Electronically Signed Final Report   03/10/2022 11:55 am ----------------------------------------------------------------------  [place] MFM OB FOLLOW UP  Result Date: 03/01/2022 ----------------------------------------------------------------------  OBSTETRICS REPORT                       (Signed Final 03/01/2022 03:33 pm) ---------------------------------------------------------------------- Patient Info  ID #:       [id number]                          D.O.B.:  [birth year] (42 yrs)  Name:       Quitman Patel                  Visit Date: 03/01/2022 02:54 pm              CHARLES SAINTERMOND ---------------------------------------------------------------------- Performed By  Attending:        Johnell Comings MD         Ref. Address:     [address]                                                             [place], [place]  Performed By:     Rolm Bookbinder RDMS     Location[facility]         [facility]  [facility]etal [facility] at                                                             [facility]  Referred By:      Osborne Oman MD ---------------------------------------------------------------------- Orders  #  Description                           Code        Ordered By  1  [place] MFM OB FOLLOW UP                   [id number]    Sander Nephew  2  [place] MFM FETAL BPP WO NON               76819.01    CORENTHIAN     STRESS                                            BOOKER ----------------------------------------------------------------------  #  Order #                     Accession #                Episode #  1  [id number]                   [id number]                 [id number]  2  [id number]                   [id number]                 [id number] ---------------------------------------------------------------------- Indications  Pre-existing diabetes, type 2, in pregnancy,   O24.113  third trimester (metformin)  Advanced maternal age multigravida 21+,        O94.523  third trimester  [redacted] weeks gestation of pregnancy                Z3A.32  History of cesarean delivery, currently        O32.219  pregnant  LR NIPS ---------------------------------------------------------------------- Fetal Evaluation  Num Of Fetuses:         1  Cardiac Activity:       Observed  Presentation:           Cephalic  Placenta:               Posterior  P. Cord Insertion:      Visualized  Amniotic Fluid  AFI FV:      Within normal limits  AFI Sum(cm)     %Tile       Largest Pocket(cm)  15.52  55          5.53  RUQ(cm)       RLQ(cm)       LUQ(cm)        LLQ(cm)  5.19          2.72          5.53           2.08 ---------------------------------------------------------------------- Biophysical Evaluation  Amniotic F.V:   Within normal limits       F. Tone:        Observed  F. Movement:    Observed                   Score:          8/8  F. Breathing:   Observed ---------------------------------------------------------------------- Biometry  BPD:     80.92  mm     G. Age:  32w 4d         44  %    CI:         72.76   %    70 - 86                                                          FL/HC:      20.8   %    19.1 - 21.3  HC:    301.66   mm     G. Age:  33w 3d         6  %    HC/AC:      1.05        0.96 - 1.17  AC:    287.49   mm     G. Age:  32w 5d         60  %    FL/BPD:     77.4   %    71 - 87  FL:      62.65  mm     G. Age:  32w 3d         37  %    FL/AC:      21.8   %    20 - 24  Est. FW:    2034  gm      4 lb 8 oz     49  % ---------------------------------------------------------------------- OB History  Gravidity:    3         Term:   2        Prem:   0        SAB:   0  TOP:          0       Ectopic:  0        Living: 2 ---------------------------------------------------------------------- Gestational Age  LMP:           32w 3d        Date:  07/17/21                  EDD:   04/23/22  U/S Today:     32w 6d  EDD:   04/20/22  Best:          Milderd Meager 3d     Det. By:  LMP  (07/17/21)          EDD:   04/23/22 ---------------------------------------------------------------------- Anatomy  Cranium:               Appears normal         Kidneys:                Appear normal  Stomach:               Appears normal, left   Bladder:                Appears normal                         sided ---------------------------------------------------------------------- Comments  This patient was seen for a follow up growth scan due to a  history of pregestational diabetes treated with metformin.  She denies any problems since her last exam and reports  good glycemic control.  She was informed that the fetal growth and amniotic fluid  level appears appropriate for her gestational age.  A BPP performed today was 8 out of 8.  She will return in 1 week for another BPP. ----------------------------------------------------------------------                   Johnell Comings, MD Electronically Signed Final Report   03/01/2022 03:33 pm ----------------------------------------------------------------------  [place]  MFM FETAL BPP WO NON STRESS  Result Date: 03/01/2022 ----------------------------------------------------------------------  OBSTETRICS REPORT                       (Signed Final 03/01/2022 03:33 pm) ---------------------------------------------------------------------- Patient Info  ID #:       [id number]                          D.O.B.:  [birth year] (42 yrs)  Name:       Quitman Patel                  Visit Date: 03/01/2022 02:54 pm              CHARLES SAINTERMOND ---------------------------------------------------------------------- Performed By  Attending:        Johnell Comings MD         Ref. Address:     [address]                                                             [place], [place]  Performed By:     Rolm Bookbinder RDMS     Location[facility]         [facility]  [facility] at                                                             [facility]  Referred By:      Osborne Oman MD ---------------------------------------------------------------------- Orders  #  Description                           Code        Ordered By  1  [place] MFM OB FOLLOW UP                   [id number]    Sander Nephew  2  [place] MFM FETAL BPP WO NON               76819.01    CORENTHIAN     STRESS                                            BOOKER ----------------------------------------------------------------------  #  Order #                     Accession #                Episode #  1  [id number]                   [id number]                 [id number]  2  [id number]                   [id number]                 [id number] ---------------------------------------------------------------------- Indications  Pre-existing diabetes, type 2, in pregnancy,   O24.113  third trimester (metformin)  Advanced maternal age multigravida 68+,         O74.523  third trimester  [redacted] weeks gestation of pregnancy                Z3A.32  History of cesarean delivery, currently        O84.219  pregnant  LR NIPS ---------------------------------------------------------------------- Fetal Evaluation  Num Of Fetuses:         1  Cardiac Activity:       Observed  Presentation:           Cephalic  Placenta:               Posterior  P. Cord Insertion:      Visualized  Amniotic Fluid  AFI FV:      Within normal limits  AFI Sum(cm)     %Tile       Largest Pocket(cm)  15.52  55          5.53  RUQ(cm)       RLQ(cm)       LUQ(cm)        LLQ(cm)  5.19          2.72          5.53           2.08 ---------------------------------------------------------------------- Biophysical Evaluation  Amniotic F.V:   Within normal limits       F. Tone:        Observed  F. Movement:    Observed                   Score:          8/8  F. Breathing:   Observed ---------------------------------------------------------------------- Biometry  BPD:     80.92  mm     G. Age:  32w 4d         44  %    CI:        72.76   %    70 - 86                                                          FL/HC:      20.8   %    19.1 - 21.3  HC:    301.66   mm     G. Age:  33w 3d         75  %    HC/AC:      1.05        0.96 - 1.17  AC:    287.49   mm     G. Age:  32w 5d         60  %    FL/BPD:     77.4   %    71 - 87  FL:      62.65  mm     G. Age:  32w 3d         37  %    FL/AC:      21.8   %    20 - 24  Est. FW:    2034  gm      4 lb 8 oz     49  % ---------------------------------------------------------------------- OB History  Gravidity:    3         Term:   2        Prem:   0        SAB:   0  TOP:          0       Ectopic:  0        Living: 2 ---------------------------------------------------------------------- Gestational Age  LMP:           32w 3d        Date:  07/17/21                  EDD:   04/23/22  U/S Today:     32w 6d  EDD:   04/20/22  Best:          Milderd Meager 3d      Det. By:  LMP  (07/17/21)          EDD:   04/23/22 ---------------------------------------------------------------------- Anatomy  Cranium:               Appears normal         Kidneys:                Appear normal  Stomach:               Appears normal, left   Bladder:                Appears normal                         sided ---------------------------------------------------------------------- Comments  This patient was seen for a follow up growth scan due to a  history of pregestational diabetes treated with metformin.  She denies any problems since her last exam and reports  good glycemic control.  She was informed that the fetal growth and amniotic fluid  level appears appropriate for her gestational age.  A BPP performed today was 8 out of 8.  She will return in 1 week for another BPP. ----------------------------------------------------------------------                   Johnell Comings, MD Electronically Signed Final Report   03/01/2022 03:33 pm ----------------------------------------------------------------------   Assessment and Plan:  Pregnancy: [id number] at 90w3d1. Pre-existing type 2 diabetes mellitus in pregnancy in second trimester Reviewed blood sugars, all within range. Refilled Metformin as per her request.   Continue scans and antenatal testing as per MFM. Delivery indicated by 39 weeks, discussed possibility of needing IOL. - metFORMIN (GLUCOPHAGE) 1000 MG tablet; Take 1 tablet (1,000 mg total) by mouth 2 (two) times daily with a meal.  Dispense: 60 tablet; Refill: 2  2. Previous cesarean section complicating pregnancy Desires TOLAC, consent signed 02/02/2022.  3. Group B streptococcal bacteriuria Will get intrapartum prophylaxis.   4. [redacted] weeks gestation of pregnancy GC/Chlam cultures done today, will follow up results and manage accordingly. - Urine cytology ancillary only([place])  5. Encounter for supervision of high-risk pregnancy with multigravida of advanced maternal  age No other concerns. Preterm labor symptoms and general obstetric precautions including but not limited to vaginal bleeding, contractions, leaking of fluid and fetal movement were reviewed in detail with the patient. Please refer to After Visit Summary for other counseling recommendations.   Return in about 1 week (around 03/29/2022) for Virtual OB Visit with MD .  Future Appointments  Date Time Provider [place] 03/24/2022  2:00 PM [facility] 03/29/2022  4:10 PM PAletha Halim MD [facility]  03/31/2022 10:30 AM [facility]  04/06/2022  3:30 PM PDonnamae Jude MD [facility]  04/12/2022  4:10 PM PAletha Halim MD [facility]  04/19/2022  3:30 PM Demetries Coia, USallyanne Havers MD [facility]    UVerita Schneiders MD

## 2022-03-24 ENCOUNTER — Other Ambulatory Visit: Payer: BC Managed Care – PPO

## 2022-03-24 ENCOUNTER — Ambulatory Visit: Payer: BC Managed Care – PPO | Attending: Obstetrics

## 2022-03-24 ENCOUNTER — Other Ambulatory Visit: Payer: Self-pay

## 2022-03-24 VITALS — BP 112/82 | HR 88 | Temp 98.2°F | Resp 18 | Ht 65.0 in | Wt 160.0 lb

## 2022-03-24 DIAGNOSIS — Z3A35 35 weeks gestation of pregnancy: Secondary | ICD-10-CM | POA: Diagnosis not present

## 2022-03-24 DIAGNOSIS — E119 Type 2 diabetes mellitus without complications: Secondary | ICD-10-CM

## 2022-03-24 DIAGNOSIS — O24913 Unspecified diabetes mellitus in pregnancy, third trimester: Secondary | ICD-10-CM

## 2022-03-24 DIAGNOSIS — O09523 Supervision of elderly multigravida, third trimester: Secondary | ICD-10-CM

## 2022-03-24 DIAGNOSIS — O24113 Pre-existing diabetes mellitus, type 2, in pregnancy, third trimester: Secondary | ICD-10-CM

## 2022-03-24 DIAGNOSIS — O34219 Maternal care for unspecified type scar from previous cesarean delivery: Secondary | ICD-10-CM | POA: Diagnosis not present

## 2022-03-24 DIAGNOSIS — O09529 Supervision of elderly multigravida, unspecified trimester: Secondary | ICD-10-CM

## 2022-03-24 DIAGNOSIS — Z7984 Long term (current) use of oral hypoglycemic drugs: Secondary | ICD-10-CM

## 2022-03-24 DIAGNOSIS — Z98891 History of uterine scar from previous surgery: Secondary | ICD-10-CM

## 2022-03-24 LAB — URINE CYTOLOGY ANCILLARY ONLY
Chlamydia: NEGATIVE
Comment: NEGATIVE
Comment: NORMAL
Neisseria Gonorrhea: NEGATIVE

## 2022-03-29 ENCOUNTER — Telehealth (INDEPENDENT_AMBULATORY_CARE_PROVIDER_SITE_OTHER): Payer: BC Managed Care – PPO | Admitting: Obstetrics and Gynecology

## 2022-03-29 ENCOUNTER — Other Ambulatory Visit: Payer: Self-pay

## 2022-03-29 DIAGNOSIS — R8271 Bacteriuria: Secondary | ICD-10-CM

## 2022-03-29 DIAGNOSIS — O09529 Supervision of elderly multigravida, unspecified trimester: Secondary | ICD-10-CM

## 2022-03-29 DIAGNOSIS — O24113 Pre-existing diabetes mellitus, type 2, in pregnancy, third trimester: Secondary | ICD-10-CM

## 2022-03-29 DIAGNOSIS — E119 Type 2 diabetes mellitus without complications: Secondary | ICD-10-CM

## 2022-03-29 DIAGNOSIS — Z789 Other specified health status: Secondary | ICD-10-CM

## 2022-03-29 DIAGNOSIS — Z3A36 36 weeks gestation of pregnancy: Secondary | ICD-10-CM

## 2022-03-29 DIAGNOSIS — O34219 Maternal care for unspecified type scar from previous cesarean delivery: Secondary | ICD-10-CM

## 2022-03-29 DIAGNOSIS — Z98891 History of uterine scar from previous surgery: Secondary | ICD-10-CM

## 2022-03-29 DIAGNOSIS — O09523 Supervision of elderly multigravida, third trimester: Secondary | ICD-10-CM

## 2022-03-29 DIAGNOSIS — Z7984 Long term (current) use of oral hypoglycemic drugs: Secondary | ICD-10-CM

## 2022-03-29 DIAGNOSIS — O24913 Unspecified diabetes mellitus in pregnancy, third trimester: Secondary | ICD-10-CM

## 2022-03-29 NOTE — Progress Notes (Unsigned)
TELEHEALTH OBSTETRICS VISIT ENCOUNTER NOTE  Provider location: Center for [place] at [facility]   Patient location: Home  I connected with Jessica Patel on 03/04/22 at  4:10 PM EST by telephone at home and verified that I am speaking with the correct person using two identifiers. Of note, unable to do video encounter due to technical difficulties.    I discussed the limitations, risks, security and privacy concerns of performing an evaluation and management service by telephone and the availability of in person appointments. I also discussed with the patient that there may be a patient responsible charge related to this service. The patient expressed understanding and agreed to proceed.  Subjective:  Jessica Patel is a 42 y.o. G3P2002 at 62w3dbeing followed for ongoing prenatal care.  She is currently monitored for the following issues for this high-risk pregnancy and has Type 2 diabetes mellitus without complication, without long-term current use of insulin ([place]; Mixed hyperlipidemia; Language barrier; Encounter for supervision of high-risk pregnancy with multigravida of advanced maternal age; Previous cesarean section complicating pregnancy; Group B streptococcal bacteriuria; Pre-existing type 2 diabetes mellitus during pregnancy; and Multigravida of advanced maternal age in third trimester on their problem list.  Patient reports no complaints. Reports fetal movement. Denies any contractions, bleeding or leaking of fluid.   The following portions of the patient's history were reviewed and updated as appropriate: allergies, current medications, past family history, past medical history, past social history, past surgical history and problem list.   Objective:  Last menstrual period 07/17/2021. General:  Alert, oriented and cooperative.   Mental Status: Normal mood and affect perceived. Normal judgment and thought content.  Rest of  physical exam deferred due to type of encounter  Assessment and Plan:  Pregnancy: G3P2002 at 358w3d. [redacted] weeks gestation of pregnancy D/w pt more re: birth control Routine swabs needed  2. Language barrier Interpreter used  3. Pre-existing type 2 diabetes mellitus during pregnancy in third trimester Patient on metofrmin 1000 bid.  AM fasting in the 70s and 2 hour post prandials in the 90s-110s.  Cotninue weekly testing.  Patient amenable to 39wk delivery. IOL set up for AM 3/0000000OL 2/123456cepahlic, afi 13, bpp 8/8,  1/30: 49%, 2034g, ac 60%, afi 16, 8/8 11/16 fetal echo: wnl>>postnatal newborn nursery echo recommended  4. Group B streptococcal bacteriuria Tx in labor  5. Multigravida of advanced maternal age in third trimester No issues  6. Previous cesarean section complicating pregnancy Desires tolac. Consent already signed. H/o G1 SVD  Preterm labor symptoms and general obstetric precautions including but not limited to vaginal bleeding, contractions, leaking of fluid and fetal movement were reviewed in detail with the patient.  I discussed the assessment and treatment plan with the patient. The patient was provided an opportunity to ask questions and all were answered. The patient agreed with the plan and demonstrated an understanding of the instructions. The patient was advised to call back or seek an in-person office evaluation/[facility] any [facility] or concerning symptoms. Please refer to After Visit Summary for other counseling recommendations.   I provided 10 minutes of non-face-to-face time during this encounter.  No follow-ups on file.  Future Appointments  Date Time Provider [place]2/29/2024 10:30 AM [facility] US[facility]  04/06/2022  3:30 PM PrDonnamae JudeMD [facility]  04/12/2022  4:10 PM PiAletha HalimMD [facility]  04/16/2022  7:00 AM [facility] [place] None  04/19/2022  3:30  PM Anyanwu, Sallyanne Havers, MD [facility]    Aletha Halim, MD [facility] for [facility] [place]

## 2022-03-30 ENCOUNTER — Inpatient Hospital Stay (EMERGENCY_DEPARTMENT_HOSPITAL)
Admission: AD | Admit: 2022-03-30 | Discharge: 2022-03-30 | Disposition: A | Discharge status: Home / Self Care | Payer: BC Managed Care – PPO | Source: Home / Self Care | Attending: Obstetrics and Gynecology | Admitting: Obstetrics and Gynecology

## 2022-03-30 ENCOUNTER — Inpatient Hospital Stay (HOSPITAL_COMMUNITY): Payer: BC Managed Care – PPO

## 2022-03-30 ENCOUNTER — Encounter (HOSPITAL_COMMUNITY): Payer: Self-pay | Admitting: Obstetrics and Gynecology

## 2022-03-30 DIAGNOSIS — O42013 Preterm premature rupture of membranes, onset of labor within 24 hours of rupture, third trimester: Secondary | ICD-10-CM

## 2022-03-30 DIAGNOSIS — O09529 Supervision of elderly multigravida, unspecified trimester: Secondary | ICD-10-CM

## 2022-03-30 DIAGNOSIS — O479 False labor, unspecified: Secondary | ICD-10-CM | POA: Diagnosis not present

## 2022-03-30 DIAGNOSIS — O09523 Supervision of elderly multigravida, third trimester: Secondary | ICD-10-CM | POA: Insufficient documentation

## 2022-03-30 DIAGNOSIS — O24113 Pre-existing diabetes mellitus, type 2, in pregnancy, third trimester: Secondary | ICD-10-CM

## 2022-03-30 DIAGNOSIS — O4703 False labor before 37 completed weeks of gestation, third trimester: Secondary | ICD-10-CM | POA: Insufficient documentation

## 2022-03-30 DIAGNOSIS — O42913 Preterm premature rupture of membranes, unspecified as to length of time between rupture and onset of labor, third trimester: Secondary | ICD-10-CM | POA: Insufficient documentation

## 2022-03-30 DIAGNOSIS — Z3A36 36 weeks gestation of pregnancy: Secondary | ICD-10-CM | POA: Diagnosis not present

## 2022-03-30 LAB — URINALYSIS, ROUTINE W REFLEX MICROSCOPIC
Bilirubin Urine: NEGATIVE
Glucose, UA: NEGATIVE mg/dL
Ketones, ur: NEGATIVE mg/dL
Leukocytes,Ua: NEGATIVE
Nitrite: NEGATIVE
Protein, ur: NEGATIVE mg/dL
Specific Gravity, Urine: 1.005 (ref 1.005–1.030)
pH: 6 (ref 5.0–8.0)

## 2022-03-30 LAB — POCT FERN TEST: POCT Fern Test: POSITIVE

## 2022-03-30 LAB — AMNISURE RUPTURE OF MEMBRANE (ROM) NOT AT ARMC: Amnisure ROM: POSITIVE

## 2022-03-30 NOTE — MAU Provider Note (Signed)
History     CSN: [id number]  Arrival date and time: 03/30/22 1647   Event Date/Time   First Provider Initiated Contact with Patient 03/30/22 1840      Chief Complaint  Patient presents with   Abdominal Pain   Jessica Patel , a  42 y.o. [id number] at 15w4dpresents to MAU with complaints of red vaginal discharge that started about 10 am this morning. She reports it started off brown and then became bright red like blood. She denies having to wear a pad and denies passing clots. She states she only sees it with wiping. She denies leaking of any fluid and reports positive fetal movement.          OB History     Gravida  3   Para  2   Term  2   Preterm      AB      Living  2      SAB      IAB      Ectopic      Multiple      Live Births  2        Obstetric Comments  G2: 2021 c-section.         Past Medical History:  Diagnosis Date   Diabetes mellitus without complication ([place]     Past Surgical History:  Procedure Laterality Date   CESAREAN SECTION      Family History  Problem Relation Age of Onset   Hypertension Mother     Social History   Tobacco Use   Smoking status: Never   Smokeless tobacco: Never  Vaping Use   Vaping Use: Never used  Substance Use Topics   Alcohol use: Never   Drug use: Never    Allergies: No Known Allergies  Medications Prior to Admission  Medication Sig Dispense Refill Last Dose   aspirin EC 81 MG tablet Take 1 tablet (81 mg total) by mouth daily. Start taking when you are [redacted] weeks pregnant for rest of pregnancy for prevention of preeclampsia 300 tablet 2 03/30/2022   metFORMIN (GLUCOPHAGE) 1000 MG tablet Take 1 tablet (1,000 mg total) by mouth 2 (two) times daily with a meal. 60 tablet 2 03/30/2022   Prenatal Vit-Fe Fumarate-FA (PREPLUS) 27-1 MG TABS Take 1 tablet by mouth daily. 60 tablet 2 03/30/2022   ACCU-CHEK GUIDE test strip USE UP TO FOUR TIMES DAILY AS DIRECTED 100 strip 5     Accu-Chek Softclix Lancets lancets SMARTSIG:Topical 1-4 Times Daily      blood glucose meter kit and supplies KIT Dispense based on patient and insurance preference. Use up to four times daily as directed. 1 each 0     Review of Systems  Constitutional:  Negative for chills, fatigue and fever.  Eyes:  Negative for pain and visual disturbance.  Respiratory:  Negative for apnea, shortness of breath and wheezing.   Cardiovascular:  Negative for chest pain and palpitations.  Gastrointestinal:  Negative for abdominal pain, constipation, diarrhea, nausea and vomiting.  Genitourinary:  Positive for vaginal bleeding and vaginal discharge. Negative for difficulty urinating, dysuria, pelvic pain and vaginal pain.  Musculoskeletal:  Negative for back pain.  Neurological:  Negative for seizures, weakness and headaches.  Psychiatric/Behavioral:  Negative for suicidal ideas.    Physical Exam   Blood pressure 124/77, pulse 85, temperature 98.5 F (36.9 C), temperature source Oral, resp. rate 15, height '5\' 5"'$  (1.651 m), weight 71.9 kg, last menstrual period 07/17/2021, SpO2 100 %.  Physical Exam Vitals and nursing note reviewed. Exam conducted with a chaperone present.  Constitutional:      General: She is not in acute distress.    Appearance: Normal appearance.  HENT:     Head: Normocephalic.  Pulmonary:     Effort: Pulmonary effort is normal.  Genitourinary:    Vagina: Bleeding present.     Comments: Mucous-blood noted on exam. No pooling or leaking of fluid noted on speculum exam.   Dilation: 2 Effacement (%): Thick Presentation: Vertex Exam by:: Gailen Shelter, CNM  No leaking or pooling noted on glove. Bag palpable on exam.  Musculoskeletal:     Cervical back: Normal range of motion.  Skin:    General: Skin is warm and dry.  Neurological:     Mental Status: She is alert and oriented to person, place, and time.  Psychiatric:        Mood and Affect: Mood normal.    FHT: 125bpm with  moderate variability and 15x15 accels no decels.  Toco: Contractions every 2-3 min (patient unaware)   Dilation: 2 Effacement (%): Thick Presentation: Vertex Exam by:: Gailen Shelter, CNM   MAU Course  Procedures Orders Placed This Encounter  Procedures   [place] MFM OB Limited   Urinalysis, Routine w reflex microscopic -Urine, Clean Catch   Amnisure rupture of membrane (rom)not at [name] Test   Discharge patient    MDM - Maryann Alar Positive (on arrival told RN that her fluid was clear). Patient denies leaking of fluid or gush of fluid.  - Amni sure ordered and collected  - Amni sure Positive   - Given positive Fern and amnisure, patient counseled on suspicion for being ruptured and recommendation for delivery. CNM Recommended that patient have a spec exam of ultrasound to confirm rupture. Patient agrees to spec exam and cervical check.  - Negative pooling, negative fern on repeat exam. Palpable bag felt on exam. Counseled patient on a PPROM. Discussed that patient may be experiencing a high leak or fetal position may be blocking a continuous leak. Patient adamant that she is not ruptured and request to go home. CNM recommended a [place] to check AFI, in the presence of a positive fern and positive anmisure and GBS positive. Patient agrees to plan of care.   - While waiting for [place], patient called out and request to go home. She does not want to complete the [place]. CNM to patient bedside. CNM extensively counseled patient and FOB on the risk of PROM prior to 37 weeks. Reviewed risks of infection, bleeding, increased risk of c/s etc. Patient still does not want [place] testing, states that she has an appointment in the [facility] at [facility] and that she will have an [place] there. She is dressed and ready to leave. Declined [place], Request to leave.    Assessment and Plan   1. False labor   2. Encounter for supervision of high-risk pregnancy with multigravida of advanced maternal age   58. Pre-existing type 2  diabetes mellitus during pregnancy in third trimester   4. Preterm premature rupture of membranes (PPROM) with onset of labor within 24 hours of rupture in third trimester, antepartum   5. [redacted] weeks gestation of pregnancy    - CNM counseled patient again prior to leaving on risks associated with PPROM at 106 weeks pregnancy.  - Worsening signs and return precautions reviewed in depth, including signs of infection, DFM, increased vaginal bleeding and signs of labor. Patient and FOB both  verbalized understanding.  - Patient has BPP appointment with MFM tomorrow. Recommended that patient keep appointment and note any changes to MDs.  - FHT appropriate for gestational age at time of discharge. - Preterm labor precautions reviewed.  - Patient discharged home in stable condition and may return to MAU as needed.    Jacquiline Doe, MSN CNM  03/30/2022, 6:40 PM

## 2022-03-30 NOTE — MAU Note (Signed)
...  Jessica Patel is a 42 y.o. at 64w4dhere in MAU reporting: Middle lower abdominal pain that began at 36 weeks. She reports the pain feels like a cramp and the pain increases when the baby moves. She reports she has been experiencing vaginal bleeding all day today. She reports the fluids she saw first were clear and then throughout the day have turned more bloody and now the fluid is black. +FM.  Onset of complaint: [redacted] weeks gestation Pain score: 2/10 lower abdomen    FHT: 145 doppler Lab orders placed from triage:  UA

## 2022-03-31 ENCOUNTER — Other Ambulatory Visit: Payer: Self-pay

## 2022-03-31 ENCOUNTER — Ambulatory Visit: Payer: BC Managed Care – PPO | Attending: Obstetrics

## 2022-03-31 ENCOUNTER — Ambulatory Visit (HOSPITAL_BASED_OUTPATIENT_CLINIC_OR_DEPARTMENT_OTHER): Payer: BC Managed Care – PPO | Admitting: Obstetrics

## 2022-03-31 VITALS — BP 114/81 | HR 76 | Temp 97.7°F | Ht 65.0 in | Wt 158.0 lb

## 2022-03-31 DIAGNOSIS — O24913 Unspecified diabetes mellitus in pregnancy, third trimester: Secondary | ICD-10-CM

## 2022-03-31 DIAGNOSIS — Z7984 Long term (current) use of oral hypoglycemic drugs: Secondary | ICD-10-CM

## 2022-03-31 DIAGNOSIS — O42113 Preterm premature rupture of membranes, onset of labor more than 24 hours following rupture, third trimester: Secondary | ICD-10-CM

## 2022-03-31 DIAGNOSIS — E119 Type 2 diabetes mellitus without complications: Secondary | ICD-10-CM

## 2022-03-31 DIAGNOSIS — O42913 Preterm premature rupture of membranes, unspecified as to length of time between rupture and onset of labor, third trimester: Secondary | ICD-10-CM | POA: Diagnosis present

## 2022-03-31 DIAGNOSIS — O34219 Maternal care for unspecified type scar from previous cesarean delivery: Secondary | ICD-10-CM

## 2022-03-31 DIAGNOSIS — O09523 Supervision of elderly multigravida, third trimester: Secondary | ICD-10-CM

## 2022-03-31 DIAGNOSIS — O24113 Pre-existing diabetes mellitus, type 2, in pregnancy, third trimester: Secondary | ICD-10-CM

## 2022-03-31 DIAGNOSIS — Z3A36 36 weeks gestation of pregnancy: Secondary | ICD-10-CM | POA: Diagnosis not present

## 2022-03-31 DIAGNOSIS — O09529 Supervision of elderly multigravida, unspecified trimester: Secondary | ICD-10-CM

## 2022-03-31 DIAGNOSIS — Z98891 History of uterine scar from previous surgery: Secondary | ICD-10-CM

## 2022-03-31 NOTE — Progress Notes (Signed)
MFM Note  Jessica Patel was seen for a BPP and growth scan due to pregestational diabetes treated with metformin.  She is currently 36 weeks and 5 days.    The patient presented to the MAU last evening due to leakage of fluid and vaginal bleeding.  Her fern test and AmniSure were both positive, indicating that she most likely ruptured membranes.    The patient and her husband left the hospital after being informed that she ruptured membranes as they wanted to come to today's ultrasound to check the amniotic fluid level.  They did not believe that she ruptured membranes and would prefer to delay delivery until 39 weeks.  She denies any further leakage of fluid and reports fetal movements.  She denies any signs of an intrauterine infection.   The overall EFW measured today was 6 pounds 6 ounces (42nd percentile).    There was normal amniotic fluid noted on today's exam with a total AFI of 12.97 cm.  The fetus is in the vertex presentation.  A BPP performed today was 8 out of 8.    The increased risk of adverse pregnancy outcomes such as an intrauterine infection, fetal infection, maternal sepsis, and maternal/fetal death with prolonged rupture of membranes was discussed.    The patient and her husband were advised that my recommendation is for her to be delivered as soon as possible due to rupture of membranes in order to avoid an adverse pregnancy outcome.  After a long discussion, the couple agreed for an induction to be scheduled tomorrow night (Friday night) or Saturday morning.    The patient stated that she would prefer to avoid a cesarean delivery if possible.  Her last delivery was a C-section due to breech presentation.  They were reassured that as her cervix is 2 cm dilated and as she has had a prior vaginal delivery, her chances are good for a successful vaginal delivery at this time.  I contacted the second attending who will schedule the patient for an induction.     The patient and her husband stated that they are comfortable with this plan.    They were advised to go to the hospital before her scheduled induction should she show any signs of an intrauterine infection, should she have any foul-smelling vaginal discharge, or for decreased fetal movements.  A total of 20 minutes was spent counseling and coordinating the care for this patient.  Greater than 50% of the time was spent in direct face-to-face contact.

## 2022-04-01 ENCOUNTER — Inpatient Hospital Stay (HOSPITAL_COMMUNITY): Payer: BC Managed Care – PPO

## 2022-04-01 ENCOUNTER — Encounter (HOSPITAL_COMMUNITY): Payer: Self-pay | Admitting: Obstetrics & Gynecology

## 2022-04-01 ENCOUNTER — Inpatient Hospital Stay (HOSPITAL_COMMUNITY)
Admission: RE | Admit: 2022-04-01 | Discharge: 2022-04-03 | DRG: 805 | Disposition: A | Discharge status: Home / Self Care | Payer: BC Managed Care – PPO | Attending: Obstetrics and Gynecology | Admitting: Obstetrics and Gynecology

## 2022-04-01 ENCOUNTER — Other Ambulatory Visit: Payer: Self-pay

## 2022-04-01 ENCOUNTER — Telehealth: Payer: Self-pay | Admitting: Obstetrics and Gynecology

## 2022-04-01 DIAGNOSIS — E785 Hyperlipidemia, unspecified: Secondary | ICD-10-CM | POA: Diagnosis present

## 2022-04-01 DIAGNOSIS — O34219 Maternal care for unspecified type scar from previous cesarean delivery: Secondary | ICD-10-CM | POA: Diagnosis present

## 2022-04-01 DIAGNOSIS — E1165 Type 2 diabetes mellitus with hyperglycemia: Secondary | ICD-10-CM | POA: Diagnosis not present

## 2022-04-01 DIAGNOSIS — O2412 Pre-existing diabetes mellitus, type 2, in childbirth: Secondary | ICD-10-CM | POA: Diagnosis present

## 2022-04-01 DIAGNOSIS — Z3A36 36 weeks gestation of pregnancy: Secondary | ICD-10-CM | POA: Diagnosis not present

## 2022-04-01 DIAGNOSIS — O4290 Premature rupture of membranes, unspecified as to length of time between rupture and onset of labor, unspecified weeks of gestation: Principal | ICD-10-CM | POA: Diagnosis present

## 2022-04-01 DIAGNOSIS — O99820 Streptococcus B carrier state complicating pregnancy: Secondary | ICD-10-CM | POA: Diagnosis not present

## 2022-04-01 DIAGNOSIS — O99824 Streptococcus B carrier state complicating childbirth: Secondary | ICD-10-CM | POA: Diagnosis present

## 2022-04-01 DIAGNOSIS — O42913 Preterm premature rupture of membranes, unspecified as to length of time between rupture and onset of labor, third trimester: Secondary | ICD-10-CM | POA: Diagnosis present

## 2022-04-01 DIAGNOSIS — Z7984 Long term (current) use of oral hypoglycemic drugs: Secondary | ICD-10-CM | POA: Diagnosis not present

## 2022-04-01 DIAGNOSIS — O42013 Preterm premature rupture of membranes, onset of labor within 24 hours of rupture, third trimester: Secondary | ICD-10-CM | POA: Diagnosis not present

## 2022-04-01 DIAGNOSIS — O99284 Endocrine, nutritional and metabolic diseases complicating childbirth: Secondary | ICD-10-CM | POA: Diagnosis present

## 2022-04-01 DIAGNOSIS — O34211 Maternal care for low transverse scar from previous cesarean delivery: Secondary | ICD-10-CM | POA: Diagnosis not present

## 2022-04-01 DIAGNOSIS — O09523 Supervision of elderly multigravida, third trimester: Secondary | ICD-10-CM | POA: Diagnosis not present

## 2022-04-01 LAB — TYPE AND SCREEN
ABO/RH(D): O POS
Antibody Screen: NEGATIVE

## 2022-04-01 LAB — CBC
HCT: 35.7 % — ABNORMAL LOW (ref 36.0–46.0)
Hemoglobin: 12.7 g/dL (ref 12.0–15.0)
MCH: 31.3 pg (ref 26.0–34.0)
MCHC: 35.6 g/dL (ref 30.0–36.0)
MCV: 87.9 fL (ref 80.0–100.0)
Platelets: 111 10*3/uL — ABNORMAL LOW (ref 150–400)
RBC: 4.06 MIL/uL (ref 3.87–5.11)
RDW: 13.4 % (ref 11.5–15.5)
WBC: 5.5 10*3/uL (ref 4.0–10.5)
nRBC: 0 % (ref 0.0–0.2)

## 2022-04-01 MED ORDER — OXYTOCIN-SODIUM CHLORIDE 30-0.9 UT/500ML-% IV SOLN
2.5000 [IU]/h | INTRAVENOUS | Status: DC
  Filled 2022-04-01: qty 500

## 2022-04-01 MED ORDER — ACETAMINOPHEN 325 MG PO TABS: 650.0000 mg | ORAL_TABLET | ORAL | Status: DC | PRN

## 2022-04-01 MED ORDER — SOD CITRATE-CITRIC ACID 500-334 MG/5ML PO SOLN: 30.0000 mL | ORAL | Status: DC | PRN

## 2022-04-01 MED ORDER — SODIUM CHLORIDE 0.9 % IV SOLN
5.0000 10*6.[IU] | Freq: Once | INTRAVENOUS | Status: AC
  Administered 2022-04-01: 5 10*6.[IU] via INTRAVENOUS
  Filled 2022-04-01: qty 5

## 2022-04-01 MED ORDER — OXYTOCIN-SODIUM CHLORIDE 30-0.9 UT/500ML-% IV SOLN
1.0000 m[IU]/min | INTRAVENOUS | Status: DC
  Administered 2022-04-01: 2 m[IU]/min via INTRAVENOUS

## 2022-04-01 MED ORDER — OXYTOCIN BOLUS FROM INFUSION: 333.0000 mL | Freq: Once | INTRAVENOUS | Status: DC

## 2022-04-01 MED ORDER — LIDOCAINE HCL (PF) 1 % IJ SOLN: 30.0000 mL | INTRAMUSCULAR | Status: DC | PRN

## 2022-04-01 MED ORDER — TERBUTALINE SULFATE 1 MG/ML IJ SOLN: 0.2500 mg | Freq: Once | INTRAMUSCULAR | Status: DC | PRN

## 2022-04-01 MED ORDER — LACTATED RINGERS IV SOLN: INTRAVENOUS | Status: DC

## 2022-04-01 MED ORDER — LACTATED RINGERS IV SOLN: 500.0000 mL | INTRAVENOUS | Status: DC | PRN

## 2022-04-01 MED ORDER — ONDANSETRON HCL 4 MG/2ML IJ SOLN: 4.0000 mg | Freq: Four times a day (QID) | INTRAMUSCULAR | Status: DC | PRN

## 2022-04-01 MED ORDER — PENICILLIN G POT IN DEXTROSE 60000 UNIT/ML IV SOLN
3.0000 10*6.[IU] | INTRAVENOUS | Status: DC
  Administered 2022-04-02 (×2): 3 10*6.[IU] via INTRAVENOUS
  Filled 2022-04-01 (×2): qty 50

## 2022-04-01 NOTE — Telephone Encounter (Addendum)
OB Telephone Note Patient called with phone interpreter at [phone] x 2 and no answer. I called her husband and spoke to him with the interpreter  I d/w him that his wife's water is broken, and that she needs to come to the hospital immediately to be delivered because of risk to mom and baby. He states that he can't right now b/c he is at work 1.5-2 hours away and he was going to come tonight. I told him that with his wife's water broken, they need to come immediately; he got his wife on the call  She confirms what I told her husband and she also states that when she was in [facility] triage and at her mfm u/s the next day that they both told her that the water is broken and she needs to come to the hospital immediately to be delivered. She states she can't until tonight b/c of childcare issues. I told her that she needs to come immediately b/c the longer she waits the higher the risk of problems to her and the baby. I told them that if he can't bring her quickly that they can always call 911.  I told them multiple times that they do not have an appointment to come to the hospital and that they do not need to wait for a certain time to come or for [place] to call them to come. I told them to come to the hospital as soon as they can and at any time and they verbalized agreement  I told them that the hospital will not call them any more and that we are waiting and expecting them so to please come as soon as they can  She denies any fevers, contractions, decreased fetal movement  Durene Romans MD Attending Center for [employer] (Faculty Practice) 04/01/2022 Time: 0930

## 2022-04-01 NOTE — H&P (Addendum)
OBSTETRIC ADMISSION HISTORY AND PHYSICAL  Jessica Patel is a 42 y.o. female [phone] with IUP at 41w6dby LMP presenting for PPROM diagnosed in MAU 2/28. She reports +FMs, No LOF, no VB, no blurry vision, headaches or peripheral edema, and RUQ pain.  She plans on breast feeding. She is unsure of what she wants for birth control. [facility]   Dating: By LMP --->  Estimated Date of Delivery: 04/23/22  Sono:    '@[redacted]w[redacted]d'$ , CWD, normal anatomy, cephalic presentation, posterior placental lie, 2892g, 42% EFW, 6lb 6oz   Prenatal History/Complications:  -Hx of C/S, 2nd pregnancy due to fetal macrosomia & breech presentation. Vaginal delivery with first pregnancy, pelvis tested to 6lbs 4oz -AMA -T2DM (Metformin 1000 mg BID) -GBS bacteruria -HLD (ASA 81) -Language barrier ([place]  Past Medical History: Past Medical History:  Diagnosis Date   Diabetes mellitus without complication ([place]     Past Surgical History: Past Surgical History:  Procedure Laterality Date   CESAREAN SECTION      Obstetrical History: OB History     Gravida  3   Para  2   Term  2   Preterm      AB      Living  2      SAB      IAB      Ectopic      Multiple      Live Births  2        Obstetric Comments  G2: 2021 c-section.         Social History Social History   Socioeconomic History   Marital status: Married    Spouse name: GGlynis Patel  Number of children: 2   Years of education: 12   Highest education level: Not on file  Occupational History   Not on file  Tobacco Use   Smoking status: Never   Smokeless tobacco: Never  Vaping Use   Vaping Use: Never used  Substance and Sexual Activity   Alcohol use: Never   Drug use: Never   Sexual activity: Yes    Partners: Male  Other Topics Concern   Not on file  Social History Narrative   [place]    Social Determinants of Health   Financial  Resource Strain: Not on file  Food Insecurity: No Food Insecurity (04/01/2022)   Hunger Vital Sign    Worried About Running Out of Food in the Last Year: Never true    Ran Out of Food in the Last Year: Never true  Transportation Needs: No Transportation Needs (04/01/2022)   PRAPARE - THydrologist(Medical): No    Lack of Transportation (Non-Medical): No  Physical Activity: Not on file  Stress: Not on file  Social Connections: Not on file    Family History: Family History  Problem Relation Age of Onset   Hypertension Mother     Allergies: No Known Allergies  Medications Prior to Admission  Medication Sig Dispense Refill Last Dose   aspirin EC 81 MG tablet Take 1 tablet (81 mg total) by mouth daily. Start taking when you are [redacted] weeks pregnant for rest of pregnancy for prevention of preeclampsia 300 tablet 2 04/01/2022   metFORMIN (GLUCOPHAGE) 1000 MG tablet Take 1 tablet (1,000 mg total) by mouth 2 (two) times daily with a meal. 60 tablet 2 04/01/2022   Prenatal Vit-Fe Fumarate-FA (PREPLUS) 27-1 MG TABS  Take 1 tablet by mouth daily. 60 tablet 2 04/01/2022   ACCU-CHEK GUIDE test strip USE UP TO FOUR TIMES DAILY AS DIRECTED 100 strip 5    Accu-Chek Softclix Lancets lancets SMARTSIG:Topical 1-4 Times Daily      blood glucose meter kit and supplies KIT Dispense based on patient and insurance preference. Use up to four times daily as directed. 1 each 0      Review of Systems   All systems reviewed and negative except as stated in HPI  Blood pressure 123/77, pulse 94, temperature 99.2 F (37.3 C), temperature source Oral, resp. rate 16, height '5\' 5"'$  (1.651 m), weight 72.2 kg, last menstrual period 07/17/2021. General appearance: alert and no distress Lungs: normal effort Heart: regular rate noted Abdomen: gravid Pelvic: see below Extremities: Homans sign is negative, no sign of DVT Presentation: cephalic Fetal monitoringBaseline: 150 bpm, Variability: Good {>  6 bpm), Accelerations: Reactive, and Decelerations: Absent Uterine activityNone Dilation: 2 Effacement (%): Thick Station: Ballotable Exam by:: Janus Molder   Prenatal labs: ABO, Rh: --/--/PENDING (03/01 2249) Antibody: PENDING (03/01 2249) Rubella: 7.85 (09/05 1114) RPR: Non Reactive (01/03 1512)  HBsAg: Negative (09/05 1114)  HIV: Non Reactive (01/03 1512)  GBS:    1 hr Glucola, n/a T2DM Genetic screening  LR, female Anatomy US wnl  Prenatal Transfer Tool  Maternal Diabetes: Yes:  Diabetes Type:  Pre-pregnancy Genetic Screening: Normal Maternal Ultrasounds/Referrals: Normal Fetal Ultrasounds or other Referrals:  Referred to Materal Fetal Medicine  Maternal Substance Abuse:  No Significant Maternal Medications:  None Significant Maternal Lab Results:  Group B Strep positive Number of Prenatal Visits:greater than 3 verified prenatal visits Other Comments:  None  Results for orders placed or performed during the hospital encounter of 04/01/22 (from the past 24 hour(s))  Type and screen [place]   Collection Time: 04/01/22 10:49 PM  Result Value Ref Range   ABO/RH(D) PENDING    Antibody Screen PENDING    Sample Expiration      04/04/2022,2359 Performed at [facility], [place] [address] [place], [place] [address]     Patient Active Problem List   Diagnosis Date Noted   PROM (premature rupture of membranes) 04/01/2022   Multigravida of advanced maternal age in third trimester 02/17/2022   Pre-existing type 2 diabetes mellitus during pregnancy 11/02/2021   Group B streptococcal bacteriuria 10/11/2021   Encounter for supervision of high-risk pregnancy with multigravida of advanced maternal age 51/05/2021   Previous cesarean section complicating pregnancy [id number]   Language barrier 07/06/2021   Type 2 diabetes mellitus without complication, without long-term current use of insulin ([place]) 02/17/2021   Mixed hyperlipidemia 02/17/2021     Assessment/Plan:  Jessica Patel is a 42 y.o. [id number] at 73w6dhere for PPROM on the evening of 2/28 and desires TOLAC.   #Labor: FB placed with 40 cc. Start pit 2by2 capping at 6 until FB out then continue to titrate pit up.  #Pain: Maternally supported #FWB: Cat I  #ID: GBS ppx #MOF: Breast #MOC: Unsure, declined ppIUD #Circ:  N/a, girl  T2DM -CBG q4h with latent phase and q2h with active phase  Raechel Marcos Autry-Lott, DO  04/01/2022, 11:27 PM

## 2022-04-01 NOTE — Progress Notes (Signed)
Pt called to come in by night shift at approximately 0500 and pt said that she can't come right now but to call again later if a spot opens up.  Faculty Practice made aware.

## 2022-04-02 ENCOUNTER — Encounter (HOSPITAL_COMMUNITY): Payer: Self-pay | Admitting: Obstetrics & Gynecology

## 2022-04-02 ENCOUNTER — Inpatient Hospital Stay (HOSPITAL_COMMUNITY): Payer: BC Managed Care – PPO | Admitting: Anesthesiology

## 2022-04-02 DIAGNOSIS — O09523 Supervision of elderly multigravida, third trimester: Secondary | ICD-10-CM

## 2022-04-02 DIAGNOSIS — O34211 Maternal care for low transverse scar from previous cesarean delivery: Secondary | ICD-10-CM

## 2022-04-02 DIAGNOSIS — O42013 Preterm premature rupture of membranes, onset of labor within 24 hours of rupture, third trimester: Secondary | ICD-10-CM

## 2022-04-02 DIAGNOSIS — Z3A36 36 weeks gestation of pregnancy: Secondary | ICD-10-CM

## 2022-04-02 DIAGNOSIS — O2412 Pre-existing diabetes mellitus, type 2, in childbirth: Secondary | ICD-10-CM

## 2022-04-02 DIAGNOSIS — O99820 Streptococcus B carrier state complicating pregnancy: Secondary | ICD-10-CM

## 2022-04-02 LAB — GLUCOSE, CAPILLARY
Glucose-Capillary: 95 mg/dL (ref 70–99)
Glucose-Capillary: 92 mg/dL (ref 70–99)
Glucose-Capillary: 312 mg/dL — ABNORMAL HIGH (ref 70–99)

## 2022-04-02 LAB — RPR: RPR Ser Ql: NONREACTIVE

## 2022-04-02 MED ORDER — PRENATAL MULTIVITAMIN CH
1.0000 | ORAL_TABLET | Freq: Every day | ORAL | Status: DC
  Administered 2022-04-02 – 2022-04-03 (×2): 1 via ORAL
  Filled 2022-04-02 (×2): qty 1

## 2022-04-02 MED ORDER — PHENYLEPHRINE 80 MCG/ML (10ML) SYRINGE FOR IV PUSH (FOR BLOOD PRESSURE SUPPORT): 80.0000 ug | PREFILLED_SYRINGE | INTRAVENOUS | Status: DC | PRN

## 2022-04-02 MED ORDER — FENTANYL CITRATE (PF) 100 MCG/2ML IJ SOLN
INTRAMUSCULAR | Status: AC
  Filled 2022-04-02: qty 2

## 2022-04-02 MED ORDER — ONDANSETRON HCL 4 MG PO TABS: 4.0000 mg | ORAL_TABLET | ORAL | Status: DC | PRN

## 2022-04-02 MED ORDER — DIPHENHYDRAMINE HCL 50 MG/ML IJ SOLN: 12.5000 mg | INTRAMUSCULAR | Status: DC | PRN

## 2022-04-02 MED ORDER — FENTANYL-BUPIVACAINE-NACL 0.5-0.125-0.9 MG/250ML-% EP SOLN
12.0000 mL/h | EPIDURAL | Status: DC | PRN
  Administered 2022-04-02: 12 mL/h via EPIDURAL
  Filled 2022-04-02: qty 250

## 2022-04-02 MED ORDER — COCONUT OIL OIL: 1.0000 | TOPICAL_OIL | Status: DC | PRN

## 2022-04-02 MED ORDER — TETANUS-DIPHTH-ACELL PERTUSSIS 5-2.5-18.5 LF-MCG/0.5 IM SUSY: 0.5000 mL | PREFILLED_SYRINGE | Freq: Once | INTRAMUSCULAR | Status: DC

## 2022-04-02 MED ORDER — WITCH HAZEL-GLYCERIN EX PADS: 1.0000 | MEDICATED_PAD | CUTANEOUS | Status: DC | PRN

## 2022-04-02 MED ORDER — ONDANSETRON HCL 4 MG/2ML IJ SOLN: 4.0000 mg | INTRAMUSCULAR | Status: DC | PRN

## 2022-04-02 MED ORDER — EPHEDRINE 5 MG/ML INJ: 10.0000 mg | INTRAVENOUS | Status: DC | PRN

## 2022-04-02 MED ORDER — METFORMIN HCL 500 MG PO TABS
500.0000 mg | ORAL_TABLET | Freq: Two times a day (BID) | ORAL | Status: DC
  Administered 2022-04-02 – 2022-04-03 (×3): 500 mg via ORAL
  Filled 2022-04-02 (×3): qty 1

## 2022-04-02 MED ORDER — SIMETHICONE 80 MG PO CHEW: 80.0000 mg | CHEWABLE_TABLET | ORAL | Status: DC | PRN

## 2022-04-02 MED ORDER — SENNOSIDES-DOCUSATE SODIUM 8.6-50 MG PO TABS
2.0000 | ORAL_TABLET | Freq: Every day | ORAL | Status: DC
  Administered 2022-04-03: 2 via ORAL
  Filled 2022-04-02: qty 2

## 2022-04-02 MED ORDER — BENZOCAINE-MENTHOL 20-0.5 % EX AERO
1.0000 | INHALATION_SPRAY | CUTANEOUS | Status: DC | PRN
  Administered 2022-04-02: 1 via TOPICAL
  Filled 2022-04-02: qty 56

## 2022-04-02 MED ORDER — LACTATED RINGERS IV SOLN
500.0000 mL | Freq: Once | INTRAVENOUS | Status: AC
  Administered 2022-04-02: 500 mL via INTRAVENOUS

## 2022-04-02 MED ORDER — DIBUCAINE (PERIANAL) 1 % EX OINT: 1.0000 | TOPICAL_OINTMENT | CUTANEOUS | Status: DC | PRN

## 2022-04-02 MED ORDER — ZOLPIDEM TARTRATE 5 MG PO TABS: 5.0000 mg | ORAL_TABLET | Freq: Every evening | ORAL | Status: DC | PRN

## 2022-04-02 MED ORDER — IBUPROFEN 600 MG PO TABS
600.0000 mg | ORAL_TABLET | Freq: Four times a day (QID) | ORAL | Status: DC
  Administered 2022-04-02 – 2022-04-03 (×5): 600 mg via ORAL
  Filled 2022-04-02 (×5): qty 1

## 2022-04-02 MED ORDER — LIDOCAINE-EPINEPHRINE (PF) 2 %-1:200000 IJ SOLN
INTRAMUSCULAR | Status: DC | PRN
  Administered 2022-04-02: 5 mL via EPIDURAL

## 2022-04-02 MED ORDER — FENTANYL CITRATE (PF) 100 MCG/2ML IJ SOLN
100.0000 ug | INTRAMUSCULAR | Status: DC | PRN
  Administered 2022-04-02 (×2): 100 ug via INTRAVENOUS
  Filled 2022-04-02 (×2): qty 2

## 2022-04-02 MED ORDER — DIPHENHYDRAMINE HCL 25 MG PO CAPS: 25.0000 mg | ORAL_CAPSULE | Freq: Four times a day (QID) | ORAL | Status: DC | PRN

## 2022-04-02 MED ORDER — ACETAMINOPHEN 325 MG PO TABS
650.0000 mg | ORAL_TABLET | ORAL | Status: DC | PRN
  Administered 2022-04-02: 650 mg via ORAL
  Filled 2022-04-02: qty 2

## 2022-04-02 NOTE — Progress Notes (Signed)
Labor Progress Note Hynlee Studer is a 42 y.o. G3P2002 at 52w0dpresented for PPROM.   S: RN stated s/p FB and the patient is resting.   O:  BP 112/80   Pulse 68   Temp 99.1 F (37.3 C) (Oral)   Resp 16   Ht '5\' 5"'$  (1.651 m)   Wt 72.2 kg   LMP 07/17/2021   BMI 26.48 kg/m  EFM: 120bpm/moderate/+accels, no decels  CVE: Dilation: 3.5 Effacement (%): 80 Station: -3 Presentation: Vertex Exam by:: ADiona Browner RN   A&P: 42y.o. GCO:[id number] for PPROM #Labor: Progressing well. S/p FB on 6 of pit. Continue to titrate up.  #Pain: Maternally supported, IV fentanyl #FWB: Cat I  #GBS positive, PCN ppx  T2DM -CBGs Q4H latent labor, q2h active phase  Vance Hochmuth Autry-Lott, DO 2:37 AM

## 2022-04-02 NOTE — Lactation Note (Addendum)
This note was copied from a baby's chart. Lactation Consultation Note  Patient Name: Jessica Patel [id number] Date: 04/02/2022 Age:41 hours Reason for consult: Initial assessment;Early term 37-38.6wks;Mother's request;Maternal endocrine disorder T2DM, AMA  LC in to visit with P3 Mom of ET infant delivered by VBAC.  This baby was offered the breast once and didn't latch.  Baby has gotten 2 formula feedings by bottle. Mom reports she did not every produce milk with her 2 other babies who are 2 and 3.  Mom denies pumping and did say both babies were given bottles in the hospital.    [place] talked to Mom about her options with regards to breastfeeding or breast milk feeding.   LC recommended baby being STS as much as possible to stimulate breast milk producing hormones and encourage baby to showing feeding cues.  The other option would be to consistently pump using a hospital grade DEBP at every feeding.  Mom agreeable to this.  [place] set up DEBP and assisted Mom to pump for first time with hand's free pumping band.  FOB and Mom taught about importance of disassembling pump parts, washing, rinsing and air drying in bins provided.  Mom does not have a pump at home.  STORK pump requested.  Encouraged Mom to place baby STS on her chest after pumping and call for assistance when baby starts cueing.  Plan recommended- 1- STS with baby as much as possible 2- call for assistance when baby starts acting hungry 3- Pump both breasts on initiation setting if baby is supplemented and AFTER breastfeeding due to history of no milk with other babies.  Maternal Data Has patient been taught Hand Expression?: Yes Does the patient have breastfeeding experience prior to this delivery?: Yes How long did the patient breastfeed?: unknown, but Mom states she never got milk  Feeding Mother's Current Feeding Choice: Breast Milk and Formula Nipple Type: Slow - flow  LLactation Tools Discussed/Used Tools:  Pump;Flanges;Bottle;Hands-free pumping top Flange Size: 21 Breast pump type: Double-Electric Breast Pump Pump Education: Setup, frequency, and cleaning Reason for Pumping: Support milk supply/infant being bottle fed Pumping frequency: Encouraged Mom to pump every 2-3 hrs when baby is fed bottle  Interventions Interventions: Breast feeding basics reviewed;Skin to skin;Breast massage;Hand express;DEBP;LC Services brochure  Discharge Pump:  (Requested a STORK pump)  Consult Status Consult Status: Follow-up Date: 04/03/22 Follow-up type: [place] 04/02/2022, 5:00 PM

## 2022-04-02 NOTE — Progress Notes (Signed)
Labor Progress Note Jessica Patel is a 42 y.o. G3P2002 at 64w0dpresented for PPROM.   S: No acute concerns.   O:  BP 110/69   Pulse 72   Temp 98.5 F (36.9 C) (Oral)   Resp 16   Ht '5\' 5"'$  (1.651 m)   Wt 72.2 kg   LMP 07/17/2021   BMI 26.48 kg/m  EFM: 120bpm/moderate/+accels, no decels  CVE: Dilation: 5 Effacement (%): 60, 70 Station: -3 Presentation: Vertex Exam by:: Autry-Lott, MD   A&P: 42y.o. GJK:[id number] for PPROM #Labor: Progressing well. S/p FB on 12 of pit. Continue to titrate up.  #Pain: Maternally supported, IV fentanyl #FWB: Cat I  #GBS positive, PCN ppx  T2DM CBG 92 -CBGs Q4H latent labor, q2h active phase  Jessica Patel Autry-Lott, DO 7:29 AM

## 2022-04-02 NOTE — Discharge Summary (Shared)
Postpartum Discharge Summary  Date of Service updated***     Patient Name: Jessica Patel DOB: 27-Aug-1980 MRN: [id number]  Date of admission: 04/01/2022 Delivery date:   Jessica Patel, Girl Jessica Patel  04/02/2022    Jessica Patel, [name] Jessica Patel    Delivering provider:    Raelyn Patel, Girl Jessica Patel  Patel, Jessica, Jessica Patel    Date of discharge: 04/02/2022  Admitting diagnosis: PROM (premature rupture of membranes) [O42.90] Intrauterine pregnancy: [redacted]w[redacted]d    Secondary diagnosis:  Principal Problem:   PROM (premature rupture of membranes)  Additional problems: ***  none   Discharge diagnosis: Term Pregnancy Delivered                                              Post partum procedures: none Augmentation: Pitocin and IP Foley Complications: [id number]  Hospital course: Onset of Labor With Vaginal Delivery      42y.o. yo G3P3003 at 337w0das admitted in Latent Labor on 04/01/2022. Labor course was complicated by prolonged ROM.   Membrane Rupture Time/Date: 10:00 AM ,03/30/2022   Delivery Method:VBAC, Spontaneous  Episiotomy: None  Lacerations:  None  Patient had a postpartum course complicated by ***.  She is ambulating, tolerating a regular diet, passing flatus, and urinating well. Patient is discharged home in stable condition on 04/02/22.  Newborn Data: Birth date:04/02/2022  Birth time:10:43 AM  Gender:Female  Living status:Living  Apgars:6 ,9  Weight:2940 g   Magnesium Sulfate received: No BMZ received: No Rhophylac:No MMR:No T-DaP: declined Flu: No Transfusion:No  Physical exam  Vitals:   04/02/22 1100 04/02/22 1115 04/02/22 1134 04/02/22 1145  BP: 120/74 116/77 123/75 110/75  Pulse: 94 89 76 72  Resp: '18 18 18 18  '$ Temp:      TempSrc:      Weight:      Height:       General: {Exam; general:21111117} Lochia: {Desc;  appropriate/inappropriate:30686::"appropriate"} Uterine Fundus: {Desc; firm/soft:30687} Incision: {Exam; incision:21111123} DVT Evaluation: {Exam; dvt:2111122} Labs: Lab Results  Component Value Date   WBC 5.5 04/01/2022   HGB 12.7 04/01/2022   HCT 35.7 (L) 04/01/2022   MCV 87.9 04/01/2022   PLT 111 (L) 04/01/2022      Latest Ref Rng & Units 10/05/2021   11:21 AM  CMP  Glucose 70 - 99 mg/dL 181   BUN 6 - 24 mg/dL 6   Creatinine 0.57 - 1.00 mg/dL 0.89   Sodium 134 - 144 mmol/L 136   Potassium 3.5 - 5.2 mmol/L 4.2   Chloride 96 - 106 mmol/L 102   CO2 20 - 29 mmol/L 19   Calcium 8.7 - 10.2 mg/dL 9.6   Total Protein 6.0 - 8.5 g/dL 6.9   Total Bilirubin 0.0 - 1.2 mg/dL 0.3   Alkaline Phos 44 - 121 IU/L 74   AST 0 - 40 IU/L 12   ALT 0 - 32 IU/L 8    Edinburgh Score:     No data to display           After visit meds:  Allergies as of 04/02/2022   No Known Allergies   Med Rec must be completed prior to using this [facility]        Discharge home in stable condition Infant Feeding: Breast Infant Disposition:*** Discharge instruction: per  After Visit Summary and Postpartum booklet. Activity: Advance as tolerated. Pelvic rest for 6 weeks.  Diet: {OB TF:[id number] Future Appointments: Future Appointments  Date Time Provider [place]  04/06/2022  3:30 PM Donnamae Jude, MD [facility]  04/12/2022  4:10 PM Aletha Halim, MD [facility]  04/19/2022  3:30 PM Anyanwu, Sallyanne Havers, MD [facility]   Follow up Visit:  Message sent to [facility] on 04/01/21:  Please schedule this patient for a In person postpartum visit in 6 weeks with the following provider: Any provider. Additional Postpartum F/U: n/a   High risk pregnancy complicated by:  [id number] Delivery mode:     Jessica Patel, Girl Jessica Patel [id number]  VBAC, Spontaneous    Jessica Patel, Jessica Patel    Anticipated Birth Control:   Unsure   04/02/2022 Jessica Patel, CNM

## 2022-04-02 NOTE — Anesthesia Procedure Notes (Signed)
Epidural Patient location during procedure: OB Start time: 04/02/2022 9:15 AM End time: 04/02/2022 8:25 AM  Staffing Anesthesiologist: Freddrick March, MD Performed: anesthesiologist   Preanesthetic Checklist Completed: patient identified, IV checked, risks and benefits discussed, monitors and equipment checked, pre-op evaluation and timeout performed  Epidural Patient position: sitting Prep: DuraPrep and site prepped and draped Patient monitoring: continuous pulse ox, blood pressure, heart rate and cardiac monitor Approach: midline Location: L3-L4 Injection technique: LOR air  Needle:  Needle type: Tuohy  Needle gauge: 17 G Needle length: 9 cm Needle insertion depth: 4 cm Catheter type: closed end flexible Catheter size: 19 Gauge Catheter at skin depth: 10 cm Test dose: negative  Assessment Sensory level: T8 Events: blood not aspirated, no cerebrospinal fluid, injection not painful, no injection resistance, no paresthesia and negative IV test  Additional Notes Patient identified. Risks/Benefits/Options discussed with patient including but not limited to bleeding, infection, nerve damage, paralysis, failed block, incomplete pain control, headache, blood pressure changes, nausea, vomiting, reactions to medication both or allergic, itching and postpartum back pain. Confirmed with bedside nurse the patient's most recent platelet count. Confirmed with patient that they are not currently taking any anticoagulation, have any bleeding history or any family history of bleeding disorders. Patient expressed understanding and wished to proceed. All questions were answered. Sterile technique was used throughout the entire procedure. Please see nursing notes for vital signs. Test dose was given through epidural catheter and negative prior to continuing to dose epidural or start infusion. Warning signs of high block given to the patient including shortness of breath, tingling/numbness in hands,  complete motor block, or any concerning symptoms with instructions to call for help. Patient was given instructions on fall risk and not to get out of bed. All questions and concerns addressed with instructions to call with any issues or inadequate analgesia.  Reason for block:procedure for pain

## 2022-04-02 NOTE — Progress Notes (Signed)
Notified Jessica Patel of post prandial glucose of 312. Patient states she has been sipping on apple juice since she ate. Lattie Haw Leftwich-Kirby to place orders for metformin BID. No new POCT orders until fasting in am. Maxwell Caul, Josepha Pigg

## 2022-04-02 NOTE — Progress Notes (Signed)
Upon releasing postpartum orders, no order for drawing blood glucose present. Called Jessica Patel, who ordered one post prandial blood sugar after this meal that patient is eating, and one fasting glucose in the morning. Maxwell Caul, Leretha Dykes [place]

## 2022-04-02 NOTE — Progress Notes (Signed)
Jessica Patel is a 42 y.o. [id number] at 63w0dby LMP admitted for induction of labor due to Spontaneous rupture of BOW.  Subjective: Pt feeling painful contractions, desires epidural.   Objective: BP 113/75   Pulse 89   Temp 98.5 F (36.9 C) (Oral)   Resp 18   Ht '5\' 5"'$  (1.651 m)   Wt 72.2 kg   LMP 07/17/2021   BMI 26.48 kg/m  No intake/output data recorded. No intake/output data recorded.  FHT:  FHR: 135 bpm, variability: moderate,  accelerations:  Present,  decelerations:  Absent UC:   regular, every 2 minutes SVE:   Dilation: 8 Effacement (%): 100 Station: -2 Exam [facility]:: [facility] Labs: Lab Results  Component Value Date   WBC 5.5 04/01/2022   HGB 12.7 04/01/2022   HCT 35.7 (L) 04/01/2022   MCV 87.9 04/01/2022   PLT 111 (L) 04/01/2022    Assessment / Plan: Augmentation of labor, progressing well  Labor: Progressing normally Preeclampsia:   n/a Fetal Wellbeing:  Category I Pain Control:  Labor support without medications I/D:   GBS positive Anticipated MOD:   VBAC  LFatima Blank CNM 04/02/2022, 10:10 AM

## 2022-04-02 NOTE — Anesthesia Preprocedure Evaluation (Addendum)
Anesthesia Evaluation  Patient identified by MRN, date of birth, ID band Patient awake    Reviewed: Allergy & Precautions, NPO status , Patient's Chart, lab work & pertinent test results  Airway Mallampati: II  TM Distance: >3 FB Neck ROM: Full    Dental no notable dental hx.    Pulmonary neg pulmonary ROS   Pulmonary exam normal breath sounds clear to auscultation       Cardiovascular negative cardio ROS Normal cardiovascular exam Rhythm:Regular Rate:Normal     Neuro/Psych negative neurological ROS  negative psych ROS   GI/Hepatic negative GI ROS, Neg liver ROS,,,  Endo/Other  diabetes, Gestational    Renal/GU negative Renal ROS  negative genitourinary   Musculoskeletal negative musculoskeletal ROS (+)    Abdominal   Peds  Hematology negative hematology ROS (+)   Anesthesia Other Findings Presents with PPROM, TOLAC  Reproductive/Obstetrics (+) Pregnancy                             Anesthesia Physical Anesthesia Plan  ASA: 3  Anesthesia Plan: Epidural   Post-op Pain Management:    Induction:   PONV Risk Score and Plan: Treatment may vary due to age or medical condition  Airway Management Planned: Natural Airway  Additional Equipment:   Intra-op Plan:   Post-operative Plan:   Informed Consent: I have reviewed the patients History and Physical, chart, labs and discussed the procedure including the risks, benefits and alternatives for the proposed anesthesia with the patient or authorized representative who has indicated his/her understanding and acceptance.       Plan Discussed with: Anesthesiologist  Anesthesia Plan Comments: (Patient identified. Risks, benefits, options discussed with patient including but not limited to bleeding, infection, nerve damage, paralysis, failed block, incomplete pain control, headache, blood pressure changes, nausea, vomiting, reactions to  medication, itching, and post partum back pain. Confirmed with bedside nurse the patient's most recent platelet count. Confirmed with the patient that they are not taking any anticoagulation, have any bleeding history or any family history of bleeding disorders. Patient expressed understanding and wishes to proceed. All questions were answered. )       Anesthesia Quick Evaluation

## 2022-04-03 LAB — CBC
HCT: 35 % — ABNORMAL LOW (ref 36.0–46.0)
Hemoglobin: 12.3 g/dL (ref 12.0–15.0)
MCH: 31.4 pg (ref 26.0–34.0)
MCHC: 35.1 g/dL (ref 30.0–36.0)
MCV: 89.3 fL (ref 80.0–100.0)
Platelets: 106 10*3/uL — ABNORMAL LOW (ref 150–400)
RBC: 3.92 MIL/uL (ref 3.87–5.11)
RDW: 13.7 % (ref 11.5–15.5)
WBC: 10.6 10*3/uL — ABNORMAL HIGH (ref 4.0–10.5)
nRBC: 0 % (ref 0.0–0.2)

## 2022-04-03 LAB — GLUCOSE, CAPILLARY: Glucose-Capillary: 84 mg/dL (ref 70–99)

## 2022-04-03 MED ORDER — SENNOSIDES-DOCUSATE SODIUM 8.6-50 MG PO TABS: 2.0000 | ORAL_TABLET | Freq: Every day | ORAL | 0 refills | Status: AC

## 2022-04-03 MED ORDER — BENZOCAINE-MENTHOL 20-0.5 % EX AERO: 1.0000 | INHALATION_SPRAY | CUTANEOUS | 0 refills | Status: DC | PRN

## 2022-04-03 MED ORDER — METFORMIN HCL 500 MG PO TABS: 500.0000 mg | ORAL_TABLET | Freq: Two times a day (BID) | ORAL | 0 refills | Status: AC

## 2022-04-03 NOTE — Progress Notes (Signed)
POSTPARTUM PROGRESS NOTE  Post Partum Day 1  Subjective:  Jessica Patel is a 42 y.o. [phone] s/p VD at [redacted]w[redacted]d  She reports she is doing well. No acute events overnight. She denies any problems with ambulating, voiding or po intake. Denies nausea or vomiting.  Pain is well controlled.  Lochia is minimal.  Objective: Blood pressure 119/78, pulse 64, temperature 98.2 F (36.8 C), temperature source Oral, resp. rate 18, height '5\' 5"'$  (1.651 m), weight 72.2 kg, last menstrual period 07/17/2021, SpO2 100 %, unknown if currently breastfeeding.  Physical Exam:  General: alert, cooperative and no distress Chest: no respiratory distress Heart:regular rate, distal pulses intact Abdomen: soft, nontender,  Uterine Fundus: firm, appropriately tender DVT Evaluation: No calf swelling or tenderness Extremities: no edema Skin: warm, dry  Recent Labs    04/01/22 2249 04/03/22 0436  HGB 12.7 12.3  HCT 35.7* 35.0*    Assessment/Plan: MDalicia Shircliffis a 42y.o. [id number]/p VD at 323w0d PPD#1 - Doing well  Routine postpartum care T2DM - on metformin. Very high gluc noted yesterday, in the 300s, however patient had just been sipping on juice.   FBG this am is 84, which is reassuring. Continue metformin '500mg'$  BID Contraception: unsure Feeding: breast Dispo: Plan for discharge later today or tomorrow. Patient currently unsure about getting a ride home today. She will let usKoreanow.   LOS: 2 days   ChLiliane ChannelD MPH OB Fellow, [place] [place] [facility]/04/2022

## 2022-04-03 NOTE — Anesthesia Postprocedure Evaluation (Signed)
Anesthesia Post Note  Patient: Jessica Patel  Procedure(s) Performed: AN AD HOC LABOR EPIDURAL     Patient location during evaluation: Mother Baby Anesthesia Type: Epidural Level of consciousness: awake and alert Pain management: pain level controlled Vital Signs Assessment: post-procedure vital signs reviewed and stable Respiratory status: spontaneous breathing, nonlabored ventilation and respiratory function stable Cardiovascular status: stable Postop Assessment: no headache, no backache and epidural receding Anesthetic complications: no   No notable events documented.  Last Vitals:  Vitals:   04/02/22 2320 04/03/22 0541  BP: 125/81 119/78  Pulse: 69 64  Resp: 18 18  Temp: 36.8 C 36.8 C  SpO2:      Last Pain:  Vitals:   04/03/22 0541  TempSrc: Oral  PainSc: Asleep   Pain Goal: Patients Stated Pain Goal: 2 (04/02/22 1705)                 Barkley Boards

## 2022-04-06 ENCOUNTER — Encounter: Payer: BC Managed Care – PPO | Admitting: Family Medicine

## 2022-04-12 ENCOUNTER — Encounter: Payer: BC Managed Care – PPO | Admitting: Obstetrics and Gynecology

## 2022-04-12 ENCOUNTER — Telehealth (HOSPITAL_COMMUNITY): Payer: Self-pay | Admitting: *Deleted

## 2022-04-12 NOTE — Telephone Encounter (Signed)
Attempted hopsital discharge follow-up phone call with Language Line interpreter, Jenny Reichmann [phone]. Voicemail message left for patient to return call with any questions or concerns. Erline Levine, RN, 04/12/22, [phone]

## 2022-04-16 ENCOUNTER — Inpatient Hospital Stay (HOSPITAL_COMMUNITY): Admission: RE | Admit: 2022-04-16 | Payer: BC Managed Care – PPO | Source: Home / Self Care | Admitting: Family Medicine

## 2022-04-16 ENCOUNTER — Inpatient Hospital Stay (HOSPITAL_COMMUNITY): Payer: BC Managed Care – PPO

## 2022-04-19 ENCOUNTER — Encounter: Payer: BC Managed Care – PPO | Admitting: Obstetrics & Gynecology

## 2022-04-19 ENCOUNTER — Other Ambulatory Visit: Payer: Self-pay

## 2022-04-19 ENCOUNTER — Ambulatory Visit (LOCAL_COMMUNITY_HEALTH_CENTER): Payer: Self-pay

## 2022-04-19 DIAGNOSIS — Z130 Encounter for screening for diseases of the blood and blood-forming organs and certain disorders involving the immune mechanism: Secondary | ICD-10-CM

## 2022-04-19 NOTE — Progress Notes (Signed)
Client referred to [facility] from [facility] pediatrics, after newborn has abnormal blood test.  Client seen in nurse clinic for Whole Blood Hemoglobin Electrophoresis family study.   Interpretation provided by language line Shanon Brow [phone] Loetta Rough [place])    Release for [facility] signed, and release for results from [facility] also signed.    Terriyah Westra Shelda Pal, RN

## 2022-05-09 ENCOUNTER — Ambulatory Visit: Payer: BC Managed Care – PPO | Admitting: Obstetrics & Gynecology

## 2022-05-11 ENCOUNTER — Encounter: Payer: Self-pay | Admitting: Family Medicine

## 2022-05-11 ENCOUNTER — Telehealth (INDEPENDENT_AMBULATORY_CARE_PROVIDER_SITE_OTHER): Payer: BC Managed Care – PPO | Admitting: Family Medicine

## 2022-05-11 DIAGNOSIS — Z758 Other problems related to medical facilities and other health care: Secondary | ICD-10-CM

## 2022-05-11 DIAGNOSIS — Z603 Acculturation difficulty: Secondary | ICD-10-CM

## 2022-05-11 DIAGNOSIS — E119 Type 2 diabetes mellitus without complications: Secondary | ICD-10-CM

## 2022-05-11 NOTE — Progress Notes (Signed)
Provider location: Center for [facility]   Patient location: Home  I connected with 05/11/22 at  3:50 PM EDT by Mychart Video Encounter and verified that I am speaking with the correct person using two identifiers.       I discussed the limitations, risks, security and privacy concerns of performing an evaluation and management service virtually and the availability of in person appointments. I also discussed with the patient that there may be a patient responsible charge related to this service. The patient expressed understanding and agreed to proceed.  Post Partum Visit Note Subjective:   Jessica Patel is a 42 y.o. [phone] female who presents for a postpartum visit. She is 6 weeks postpartum following a normal spontaneous vaginal delivery.  I have fully reviewed the prenatal and intrapartum course. The delivery was at 37 gestational weeks.  Anesthesia: none. Postpartum course has been Well . Baby is doing well. Baby is feeding by bottle - Similac total comfort . Bleeding no bleeding. Bowel function is normal. Bladder function is normal. Patient is not sexually active. Contraception method is condoms. Postpartum depression screening: Pt stating that she is does not understand the questioning with the interpreter, Pt stating that she does not have any depression like symptoms and she is feeling fine.     The pregnancy intention screening data noted above was reviewed. Potential methods of contraception were discussed. The patient elected to proceed with No data recorded.    The following portions of the patient's history were reviewed and updated as appropriate: allergies, current medications, past family history, past medical history, past social history, past surgical history, and problem list.  Review of Systems Pertinent items noted in HPI and remainder of comprehensive ROS otherwise negative.  Objective:  There were no vitals taken for  this visit.    General:  Alert, oriented and cooperative. Patient is in no acute distress.  Respiratory: Normal respiratory effort, no problems with respiration noted  Mental Status: Normal mood and affect. Normal behavior. Normal judgment and thought content.  Rest of physical exam deferred due to type of encounter   Assessment:    Normal postpartum exam.  Plan:  Essential components of care per ACOG recommendations:  1.  Mood and well being: Patient with negative depression screening today. Reviewed local resources for support.  - Patient does not use tobacco.  - hx of drug use? No    2. Infant care and feeding:  -Patient currently breastmilk feeding? No  -Social determinants of health (SDOH) reviewed in EPIC. No concerns  3. Sexuality, contraception and birth spacing - Patient does not want a pregnancy in the next year.  Desired family size is 3 children.  - Reviewed forms of contraception in tiered fashion. Patient desired no method today.   - Discussed birth spacing of 18 months  4. Sleep and fatigue -Encouraged family/partner/community support of 4 hrs of uninterrupted sleep to help with mood and fatigue  5. Physical Recovery  - Discussed patients delivery and complications - Patient had no laceration, perineal healing reviewed. Patient expressed understanding - Patient has urinary incontinence? No - Patient is safe to resume physical and sexual activity  6.  Health Maintenance - Last pap smear done 07/06/21 and was abnormal with ASCUS  with negative HPV. Mammogram  7. T2DM - not on meds, but checking her CBGs.Chronic Disease - PCP follow up  I provided 9 minutes of face-to-face time during this encounter.  [place] interpreter: video  used   No follow-ups on file.  No future appointments.   Reva Bores, MD Center for [employer], [facility]

## 2022-05-25 ENCOUNTER — Encounter: Payer: Self-pay | Admitting: Nurse Practitioner

## 2022-05-25 ENCOUNTER — Other Ambulatory Visit: Payer: Self-pay

## 2022-05-25 ENCOUNTER — Ambulatory Visit (INDEPENDENT_AMBULATORY_CARE_PROVIDER_SITE_OTHER): Payer: BC Managed Care – PPO | Admitting: Physician Assistant

## 2022-05-25 ENCOUNTER — Ambulatory Visit (INDEPENDENT_AMBULATORY_CARE_PROVIDER_SITE_OTHER): Payer: BC Managed Care – PPO | Admitting: Nurse Practitioner

## 2022-05-25 VITALS — BP 130/72 | HR 74 | Temp 98.1°F | Resp 18 | Ht 65.0 in | Wt 145.4 lb

## 2022-05-25 DIAGNOSIS — H1033 Unspecified acute conjunctivitis, bilateral: Secondary | ICD-10-CM

## 2022-05-25 MED ORDER — POLYMYXIN B-TRIMETHOPRIM 10000-0.1 UNIT/ML-% OP SOLN: 2.0000 [drp] | OPHTHALMIC | 0 refills | Status: AC

## 2022-05-25 NOTE — Progress Notes (Signed)
BP 130/72   Pulse 74   Temp 98.1 F (36.7 C) (Oral)   Resp 18   Ht  (1.651 m)   Wt 145 lb 6.4 oz (66 kg)   SpO2 99%   BMI 24.20 kg/m    Subjective:    Patient ID: Jessica Patel, female    DOB: 1980-07-15, 42 y.o.   MRN: [id number]  HPI: Jessica Patel is a 42 y.o. female  Chief Complaint  Patient presents with   Eye Problem    Red for 3 days   Conjunctivitis: patient reports it started in her right eye three days ago and then moved to her left eye.  Her eye is red and itchy. She denies any trauma. She says it is matted shot when she gets up in the morning. Discussed using a warm rag to help remove the mat in the morning. Will start patient on polytrim.    Vision Screening   Right eye Left eye Both eyes  Without correction  With correction        Relevant past medical, surgical, family and social history reviewed and updated as indicated. Interim medical history since our last visit reviewed. Allergies and medications reviewed and updated.  Review of Systems  Constitutional: Negative for fever or weight change.  HEETN: red itchy eyes Respiratory: Negative for cough and shortness of breath.   Cardiovascular: Negative for chest pain or palpitations.  Gastrointestinal: Negative for abdominal pain, no bowel changes.  Musculoskeletal: Negative for gait problem or joint swelling.  Skin: Negative for rash.  Neurological: Negative for dizziness or headache.  No other specific complaints in a complete review of systems (except as listed in HPI above).      Objective:    BP 130/72   Pulse 74   Temp 98.1 F (36.7 C) (Oral)   Resp 18   Ht  (1.651 m)   Wt 145 lb 6.4 oz (66 kg)   SpO2 99%   BMI 24.20 kg/m   Wt Readings from Last 3 Encounters:  05/25/22 145 lb 6.4 oz (66 kg)  04/01/22 159 lb 1.6 oz (72.2 kg)  03/31/22 158 lb (71.7 kg)    Physical Exam  Constitutional: Patient appears  well-developed and well-nourished.  No distress.  HEENT: head atraumatic, normocephalic, pupils equal and reactive to light, conjunctiva red, neck supple Cardiovascular: Normal rate, regular rhythm and normal heart sounds.  No murmur heard. No BLE edema. Pulmonary/Chest: Effort normal and breath sounds normal. No respiratory distress. Abdominal: Soft.  There is no tenderness. Psychiatric: Patient has a normal mood and affect. behavior is normal. Judgment and thought content normal.  Results for orders placed or performed during the hospital encounter of 04/01/22  CBC  Result Value Ref Range   WBC 5.5 4.0 - 10.5 K/uL   RBC 4.06 3.87 - 5.11 MIL/uL   Hemoglobin 12.7 12.0 - 15.0 g/dL   HCT 93.2 (L) 35.5 - 73.2 %   MCV 87.9 80.0 - 100.0 fL   MCH 31.3 26.0 - 34.0 pg   MCHC 35.6 30.0 - 36.0 g/dL   RDW 20.2 54.2 - 70.6 %   Platelets 111 (L) 150 - 400 K/uL   nRBC 0.0 0.0 - 0.2 %  RPR  Result Value Ref Range   RPR Ser Ql NON REACTIVE NON REACTIVE  Glucose, capillary  Result Value Ref Range   Glucose-Capillary 95 70 - 99 mg/dL  Glucose, capillary  Result Value  Ref Range   Glucose-Capillary 92 70 - 99 mg/dL  Glucose, capillary  Result Value Ref Range   Glucose-Capillary 312 (H) 70 - 99 mg/dL   Comment 1 Notify RN    Comment 2 Call MD NNP PA CNM    Comment 3 Document in Chart   CBC  Result Value Ref Range   WBC 10.6 (H) 4.0 - 10.5 K/uL   RBC 3.92 3.87 - 5.11 MIL/uL   Hemoglobin 12.3 12.0 - 15.0 g/dL   HCT 40.9 (L) 81.1 - 91.4 %   MCV 89.3 80.0 - 100.0 fL   MCH 31.4 26.0 - 34.0 pg   MCHC 35.1 30.0 - 36.0 g/dL   RDW 78.2 95.6 - 21.3 %   Platelets 106 (L) 150 - 400 K/uL   nRBC 0.0 0.0 - 0.2 %  Glucose, capillary  Result Value Ref Range   Glucose-Capillary 84 70 - 99 mg/dL  Type and screen [facility]  Result Value Ref Range   ABO/RH(D) O POS    Antibody Screen NEG    Sample Expiration      04/04/2022,2359 Performed at [facility], [address]  [place], [place] [address]       Assessment & Plan:   Problem List Items Addressed This Visit   None Visit Diagnoses     Acute bacterial conjunctivitis of both eyes    -  Primary   start polytrim, use warm wet rag in am to get rid of mat   Relevant Medications   trimethoprim-polymyxin b (POLYTRIM) ophthalmic solution        Follow up plan: Return if symptoms worsen or fail to improve.

## 2022-05-26 ENCOUNTER — Ambulatory Visit: Payer: BC Managed Care – PPO | Admitting: Family Medicine

## 2022-08-11 NOTE — Progress Notes (Signed)
Name: Jessica Patel   MRN: [id number]    DOB: 04/03/1980   Date:08/17/2022       Progress Note  Subjective  Chief Complaint  Chief Complaint  Patient presents with   Annual Exam    HPI  Patient presents for annual CPE.  Diet: low sugar and carbs due to diabetes Exercise: 1 day a week 20 minutes  Last Eye Exam: NO, needs to schedule Last Dental Exam: NO, needs to schedule  Flowsheet Row Office Visit from 08/17/2022 in [facility]  AUDIT-C Score 0      Depression: Phq 9 is  negative    08/17/2022    7:39 AM 08/17/2022    7:37 AM 05/25/2022    2:29 PM 03/24/2022    1:57 PM 03/17/2022   11:19 AM  Depression screen PHQ 2/9  Decreased Interest 0 0 0 0 0  Down, Depressed, Hopeless 0 0 0 0 0  PHQ - 2 Score 0 0 0 0 0  Altered sleeping     0  Tired, decreased energy     0  Change in appetite     0  Feeling bad or failure about yourself      0  Trouble concentrating     0  Moving slowly or fidgety/restless     0  Suicidal thoughts     0  PHQ-9 Score     0   Hypertension: BP Readings from Last 3 Encounters:  08/17/22 122/70  05/25/22 130/72  04/03/22 119/78   Obesity: Wt Readings from Last 3 Encounters:  08/17/22 149 lb 3.2 oz (67.7 kg)  05/25/22 145 lb 6.4 oz (66 kg)  04/01/22 159 lb 1.6 oz (72.2 kg)   BMI Readings from Last 3 Encounters:  08/17/22 28.66 kg/m  05/25/22 24.20 kg/m  04/01/22 26.48 kg/m     Vaccines:  HPV: up to at age 28 , ask insurance if age between 80-45  Shingrix: 72-64 yo and ask insurance if covered when patient above 109 yo Pneumonia:  educated and discussed with patient. Flu:  educated and discussed with patient.     Hep C Screening: done STD testing and prevention (HIV/chl/gon/syphilis): done Intimate partner violence: negative screen  Sexual History :yes, married Menstrual History/LMP/Abnormal Bleeding: [facility]: 07/24/2022 Discussed importance of follow up if any post-menopausal bleeding:  no  Incontinence Symptoms: negative for symptoms   Breast cancer:  - Last Mammogram: will schedule - BRCA gene screening: none  Osteoporosis Prevention : Discussed high calcium and vitamin D supplementation, weight bearing exercises Bone density :not applicable   Cervical cancer screening: followed by gyn, up to date  Skin cancer: Discussed monitoring for atypical lesions  Colorectal cancer: no concerns,does not qualify   Lung cancer:  Low Dose CT Chest recommended if Age 60-80 years, 20 pack-year currently smoking OR have quit w/in 15years. Patient does not qualify for screen   ECG:   Advanced Care Planning: A voluntary discussion about advance care planning including the explanation and discussion of advance directives.  Discussed health care proxy and Living will, and the patient was able to identify a health care proxy as husbnad.  Patient does not have a living will and power of attorney of health care   Lipids: Lab Results  Component Value Date   CHOL 232 (H) 02/16/2021   Lab Results  Component Value Date   HDL 46 (L) 02/16/2021   Lab Results  Component Value Date   LDLCALC 147 (H)  02/16/2021   Lab Results  Component Value Date   TRIG 239 (H) 02/16/2021   Lab Results  Component Value Date   CHOLHDL 5.0 (H) 02/16/2021   No results found for: "LDLDIRECT"  Glucose: Glucose  Date Value Ref Range Status  10/05/2021 181 (H) 70 - 99 mg/dL Final   Glucose, Bld  Date Value Ref Range Status  02/16/2021 342 (H) 65 - 99 mg/dL Final    Comment:    .            Fasting reference interval . For someone without known diabetes, a glucose value >125 mg/dL indicates that they may have diabetes and this should be confirmed with a follow-up test. .    Glucose-Capillary  Date Value Ref Range Status  04/03/2022 84 70 - 99 mg/dL Final    Comment:    Glucose reference range applies only to samples taken after fasting for at least 8 hours.  04/02/2022 312 (H) 70 - 99  mg/dL Final    Comment:    Glucose reference range applies only to samples taken after fasting for at least 8 hours.  04/02/2022 92 70 - 99 mg/dL Final    Comment:    Glucose reference range applies only to samples taken after fasting for at least 8 hours.    Patient Active Problem List   Diagnosis Date Noted   Language barrier 07/06/2021   Type 2 diabetes mellitus without complication, without long-term current use of insulin (HCC) 02/17/2021   Mixed hyperlipidemia 02/17/2021    Past Surgical History:  Procedure Laterality Date   CESAREAN SECTION      Family History  Problem Relation Age of Onset   Hypertension Mother     Social History   Socioeconomic History   Marital status: Married    Spouse name: Jessica Patel   Number of children: 3   Years of education: 12   Highest education level: Not on file  Occupational History   Not on file  Tobacco Use   Smoking status: Never   Smokeless tobacco: Never  Vaping Use   Vaping status: Never Used  Substance and Sexual Activity   Alcohol use: Never   Drug use: Never   Sexual activity: Yes    Partners: Male  Other Topics Concern   Not on file  Social History Narrative   Originally from [place] and speak [place]. 3 children   Social Determinants of Health   Financial Resource Strain: Low Risk  (08/17/2022)   Overall Financial Resource Strain (CARDIA)    Difficulty of Paying Living Expenses: Not hard at all  Food Insecurity: No Food Insecurity (08/17/2022)   Hunger Vital Sign    Worried About Running Out of Food in the Last Year: Never true    Ran Out of Food in the Last Year: Never true  Transportation Needs: No Transportation Needs (08/17/2022)   PRAPARE - Administrator, Civil Service (Medical): No    Lack of Transportation (Non-Medical): No  Physical Activity: Insufficiently Active (08/17/2022)   Exercise Vital Sign    Days of Exercise per Week: 1 day    Minutes of Exercise per Session: 20 min  Stress: No Stress  Concern Present (08/17/2022)   [employer] of Occupational Health - Occupational Stress Questionnaire    Feeling of Stress : Not at all  Social Connections: Moderately Integrated (08/17/2022)   Social Connection and Isolation Panel [NHANES]    Frequency of Communication with Friends and Family: More than  three times a week    Frequency of Social Gatherings with Friends and Family: More than three times a week    Attends Religious Services: More than 4 times per year    Active Member of [employer] or Organizations: No    Attends Banker Meetings: Never    Marital Status: Married  Catering manager Violence: Not At Risk (08/17/2022)   Humiliation, Afraid, Rape, and Kick questionnaire    Fear of Current or Ex-Partner: No    Emotionally Abused: No    Physically Abused: No    Sexually Abused: No     Current Outpatient Medications:    ACCU-CHEK GUIDE test strip, USE UP TO FOUR TIMES DAILY AS DIRECTED (Patient not taking: Reported on 05/25/2022), Disp: 100 strip, Rfl: 5   Accu-Chek Softclix Lancets lancets, SMARTSIG:Topical 1-4 Times Daily (Patient not taking: Reported on 05/25/2022), Disp: , Rfl:    blood glucose meter kit and supplies KIT, Dispense based on patient and insurance preference. Use up to four times daily as directed. (Patient not taking: Reported on 05/25/2022), Disp: 1 each, Rfl: 0   metFORMIN (GLUCOPHAGE) 500 MG tablet, Take 1 tablet (500 mg total) by mouth 2 (two) times daily with a meal. (Patient not taking: Reported on 05/25/2022), Disp: 60 tablet, Rfl: 0   Prenatal Vit-Fe Fumarate-FA (PREPLUS) 27-1 MG TABS, Take 1 tablet by mouth daily. (Patient not taking: Reported on 05/25/2022), Disp: 60 tablet, Rfl: 2   senna-docusate (SENOKOT-S) 8.6-50 MG tablet, Take 2 tablets by mouth daily. (Patient not taking: Reported on 05/25/2022), Disp: 30 tablet, Rfl: 0  No Known Allergies   ROS  Constitutional: Negative for fever or weight change.  Respiratory: Negative for cough and  shortness of breath.   Cardiovascular: Negative for chest pain or palpitations.  Gastrointestinal: Negative for abdominal pain, no bowel changes.  Musculoskeletal: Negative for gait problem or joint swelling.  Skin: Negative for rash.  Neurological: Negative for dizziness or headache.  No other specific complaints in a complete review of systems (except as listed in HPI above).   Objective  Vitals:   08/17/22 0733  BP: 122/70  Pulse: 84  Resp: 16  Temp: 98.4 F (36.9 C)  TempSrc: Oral  SpO2: 99%  Weight: 149 lb 3.2 oz (67.7 kg)  Height: 5' 0.5" (1.537 m)    Body mass index is 28.66 kg/m.  Physical Exam Constitutional: Patient appears well-developed and well-nourished. No distress.  HENT: Head: Normocephalic and atraumatic. Ears: B TMs ok, no erythema or effusion; Nose: Nose normal. Mouth/Throat: Oropharynx is clear and moist. No oropharyngeal exudate.  Eyes: Conjunctivae and EOM are normal. Pupils are equal, round, and reactive to light. No scleral icterus.  Neck: Normal range of motion. Neck supple. No JVD present. No thyromegaly present.  Cardiovascular: Normal rate, regular rhythm and normal heart sounds.  No murmur heard. No BLE edema. Pulmonary/Chest: Effort normal and breath sounds normal. No respiratory distress. Abdominal: Soft. Bowel sounds are normal, no distension. There is no tenderness. no masses Breast: no lumps or masses, no nipple discharge or rashes Musculoskeletal: Normal range of motion, no joint effusions. No gross deformities Neurological: he is alert and oriented to person, place, and time. No cranial nerve deficit. Coordination, balance, strength, speech and gait are normal.  Skin: Skin is warm and dry. No rash noted. No erythema.  Psychiatric: Patient has a normal mood and affect. behavior is normal. Judgment and thought content normal.   Diabetic Foot Exam - Simple   Simple Foot  Form Diabetic Foot exam was performed with the following findings: Yes  08/17/2022  7:53 AM  Visual Inspection No deformities, no ulcerations, no other skin breakdown bilaterally: Yes Sensation Testing Intact to touch and monofilament testing bilaterally: Yes Pulse Check Posterior Tibialis and Dorsalis pulse intact bilaterally: Yes Comments     No results found for this or any previous visit (from the past 2160 hour(s)).   Fall Risk:    08/17/2022    7:36 AM 05/25/2022    2:29 PM 03/31/2022   10:30 AM 03/24/2022    1:57 PM 03/17/2022   11:19 AM  Fall Risk   Falls in the past year? 0 0 0 0 0  Number falls in past yr: 0 0  0 0  Injury with Fall? 0 0   0  Risk for fall due to :    No Fall Risks   Follow up    Falls evaluation completed      Functional Status Survey: Is the patient deaf or have difficulty hearing?: No Does the patient have difficulty seeing, even when wearing glasses/contacts?: No Does the patient have difficulty concentrating, remembering, or making decisions?: No Does the patient have difficulty walking or climbing stairs?: No Does the patient have difficulty dressing or bathing?: No Does the patient have difficulty doing errands alone such as visiting a doctor's office or shopping?: No   Assessment & Plan  1. Annual physical exam  - COMPLETE METABOLIC PANEL WITH GFR - Lipid panel - Hemoglobin A1c - MM 3D SCREENING MAMMOGRAM BILATERAL BREAST; Future  2. Type 2 diabetes mellitus without complication, without long-term current use of insulin (HCC)  - COMPLETE METABOLIC PANEL WITH GFR - Hemoglobin A1c - HM Diabetes Foot Exam  3. Mixed hyperlipidemia  - COMPLETE METABOLIC PANEL WITH GFR - Lipid panel  4. Encounter for screening mammogram for malignant neoplasm of breast  - MM 3D SCREENING MAMMOGRAM BILATERAL BREAST; Future   -USPSTF grade A and B recommendations reviewed with patient; age-appropriate recommendations, preventive care, screening tests, etc discussed and encouraged; healthy living encouraged; see AVS for  patient education given to patient -Discussed importance of 150 minutes of physical activity weekly, eat two servings of fish weekly, eat one serving of tree nuts ( cashews, pistachios, pecans, almonds.Marland Kitchen) every other day, eat 6 servings of fruit/vegetables daily and drink plenty of water and avoid sweet beverages.   -Reviewed Health Maintenance: Yes.

## 2022-08-17 ENCOUNTER — Encounter: Payer: Self-pay | Admitting: Nurse Practitioner

## 2022-08-17 ENCOUNTER — Ambulatory Visit (INDEPENDENT_AMBULATORY_CARE_PROVIDER_SITE_OTHER): Payer: BC Managed Care – PPO | Admitting: Nurse Practitioner

## 2022-08-17 ENCOUNTER — Other Ambulatory Visit: Payer: Self-pay

## 2022-08-17 VITALS — BP 122/70 | HR 84 | Temp 98.4°F | Resp 16 | Ht 60.5 in | Wt 149.2 lb

## 2022-08-17 DIAGNOSIS — Z0000 Encounter for general adult medical examination without abnormal findings: Secondary | ICD-10-CM | POA: Diagnosis not present

## 2022-08-17 DIAGNOSIS — E119 Type 2 diabetes mellitus without complications: Secondary | ICD-10-CM | POA: Diagnosis not present

## 2022-08-17 DIAGNOSIS — Z1231 Encounter for screening mammogram for malignant neoplasm of breast: Secondary | ICD-10-CM | POA: Diagnosis not present

## 2022-08-17 DIAGNOSIS — E782 Mixed hyperlipidemia: Secondary | ICD-10-CM | POA: Diagnosis not present

## 2022-08-18 LAB — COMPLETE METABOLIC PANEL WITH GFR
AG Ratio: 1.7 (calc) (ref 1.0–2.5)
ALT: 15 U/L (ref 6–29)
AST: 17 U/L (ref 10–30)
Albumin: 4.5 g/dL (ref 3.6–5.1)
Alkaline phosphatase (APISO): 94 U/L (ref 31–125)
BUN: 9 mg/dL (ref 7–25)
CO2: 27 mmol/L (ref 20–32)
Calcium: 9.3 mg/dL (ref 8.6–10.2)
Chloride: 105 mmol/L (ref 98–110)
Creat: 0.94 mg/dL (ref 0.50–0.99)
Globulin: 2.7 g/dL (calc) (ref 1.9–3.7)
Glucose, Bld: 145 mg/dL — ABNORMAL HIGH (ref 65–99)
Potassium: 4.6 mmol/L (ref 3.5–5.3)
Sodium: 137 mmol/L (ref 135–146)
Total Bilirubin: 0.5 mg/dL (ref 0.2–1.2)
Total Protein: 7.2 g/dL (ref 6.1–8.1)
eGFR: 78 mL/min/{1.73_m2} (ref >=60)

## 2022-08-18 LAB — LIPID PANEL
Cholesterol: 188 mg/dL (ref <200)
HDL: 56 mg/dL (ref >=50)
LDL Cholesterol (Calc): 114 mg/dL (calc) — ABNORMAL HIGH
Non-HDL Cholesterol (Calc): 132 mg/dL (calc) — ABNORMAL HIGH (ref <130)
Total CHOL/HDL Ratio: 3.4 (calc) (ref <5.0)
Triglycerides: 84 mg/dL (ref <150)

## 2022-08-18 LAB — HEMOGLOBIN A1C
Hgb A1c MFr Bld: 7 % of total Hgb — ABNORMAL HIGH (ref <5.7)
Mean Plasma Glucose: 154 mg/dL
eAG (mmol/L): 8.5 mmol/L

## 2022-08-23 ENCOUNTER — Ambulatory Visit
Admission: RE | Admit: 2022-08-23 | Discharge: 2022-08-23 | Disposition: A | Discharge status: Home / Self Care | Payer: BC Managed Care – PPO | Source: Ambulatory Visit | Attending: Nurse Practitioner | Admitting: Nurse Practitioner

## 2022-08-23 DIAGNOSIS — Z0000 Encounter for general adult medical examination without abnormal findings: Secondary | ICD-10-CM

## 2022-08-23 DIAGNOSIS — Z1231 Encounter for screening mammogram for malignant neoplasm of breast: Secondary | ICD-10-CM | POA: Insufficient documentation

## 2024-03-23 IMAGING — MG MM DIGITAL SCREENING BILAT W/ TOMO AND CAD
8 series · 8 of 24 positions shown · non-contrast
Comparison: None available.

CLINICAL DATA: Screening.

EXAM:
DIGITAL SCREENING BILATERAL MAMMOGRAM WITH TOMOSYNTHESIS AND CAD
TECHNIQUE: Bilateral screening digital craniocaudal and mediolateral oblique
mammograms were obtained. Bilateral screening digital breast
tomosynthesis was performed. The images were evaluated with
computer-aided detection.

[L CC synth-2D]
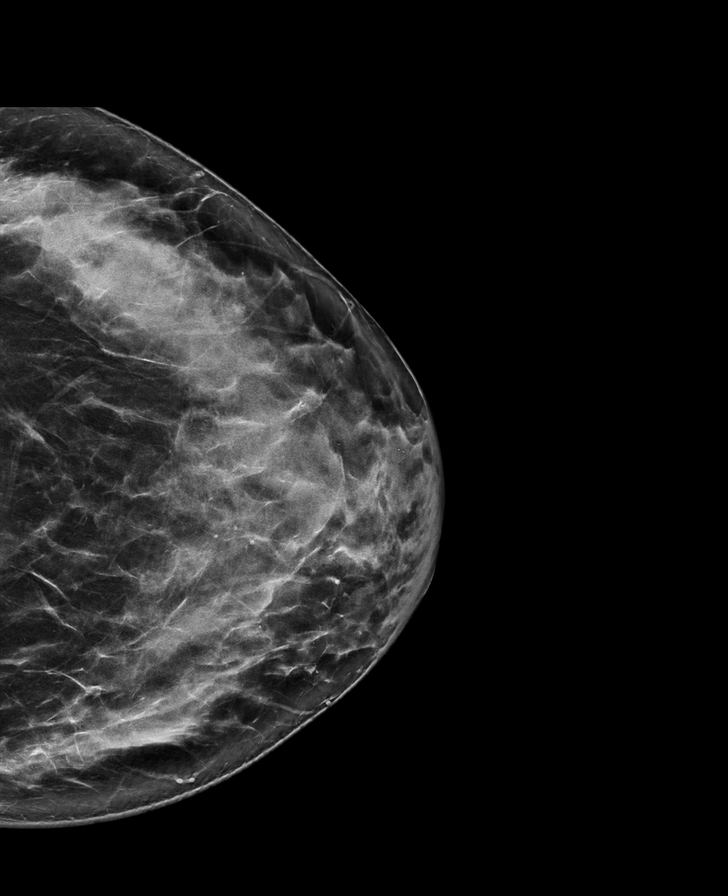

[R CC synth-2D]
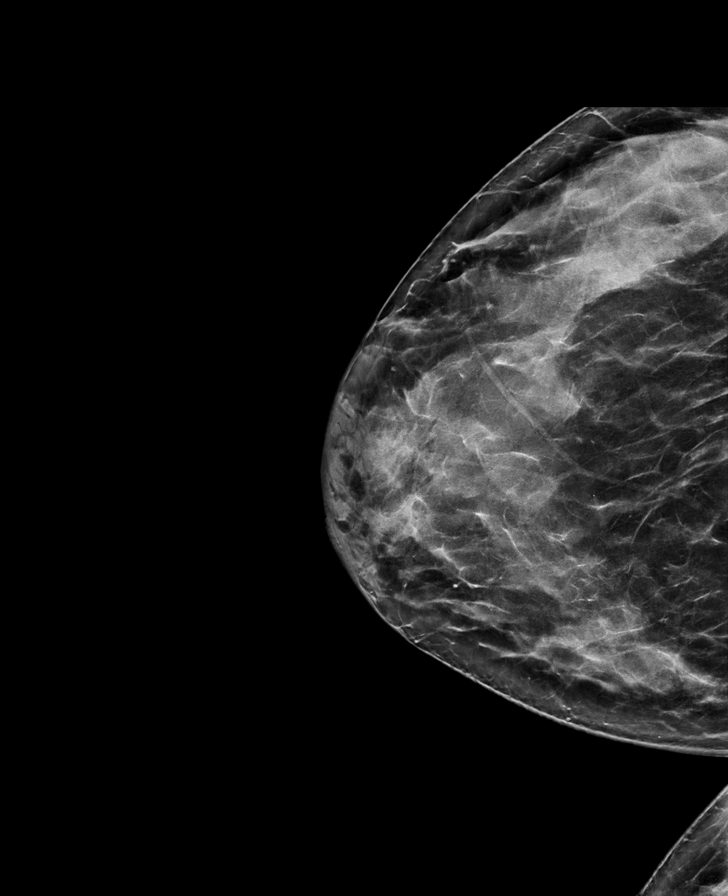

[R MLO synth-2D]
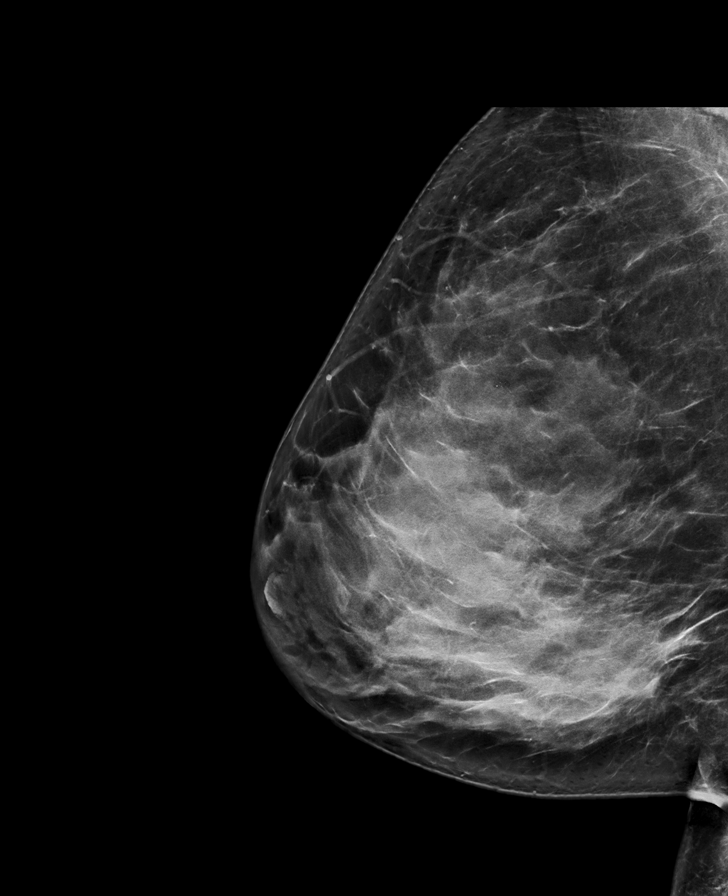

[L MLO synth-2D]
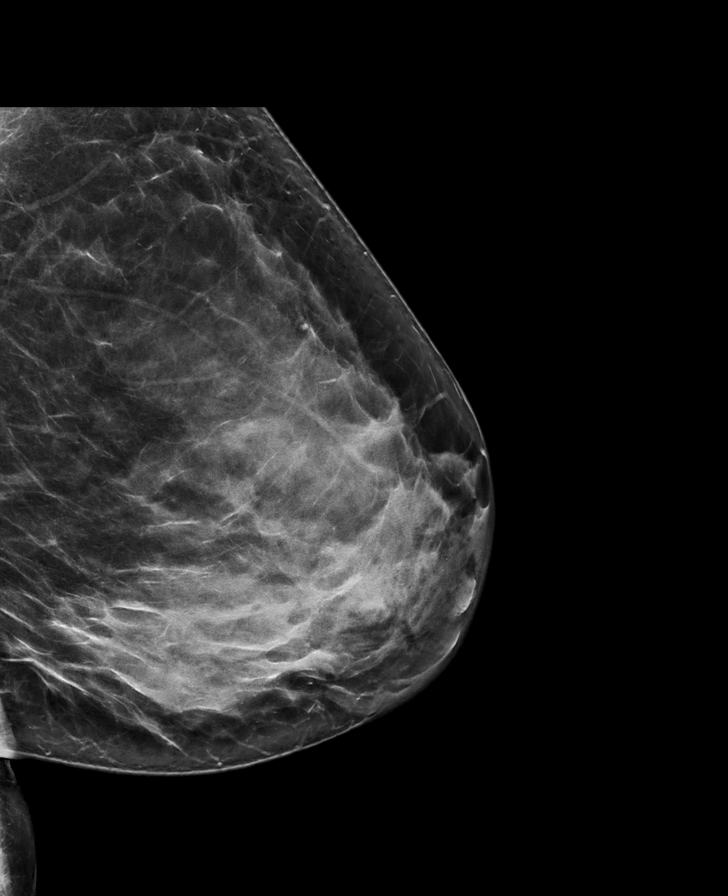

[R CC tomo · tomo slice 41/81.0]
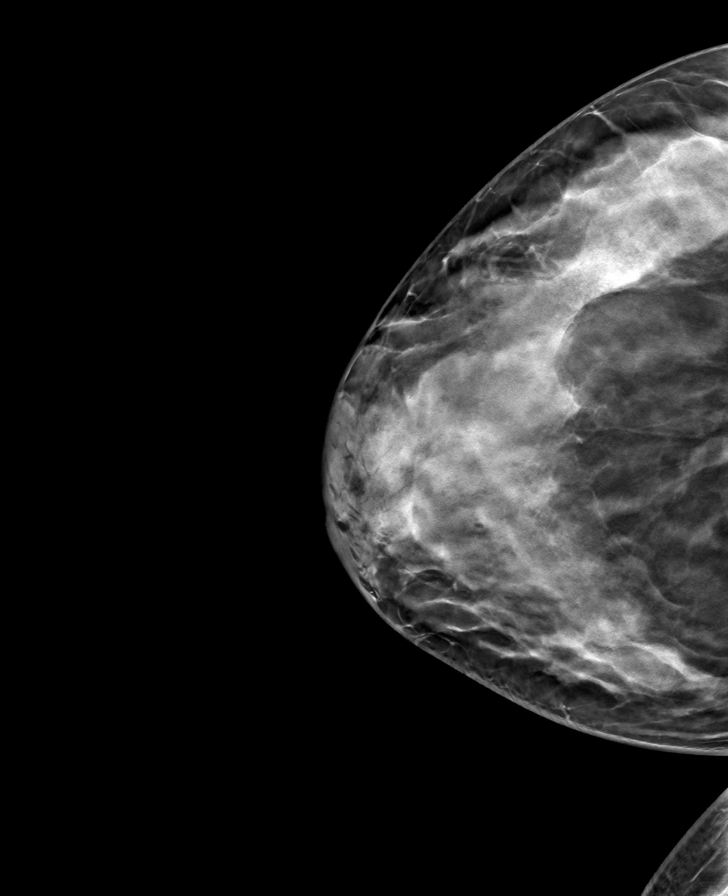

[L CC tomo · tomo slice 41/81.0]
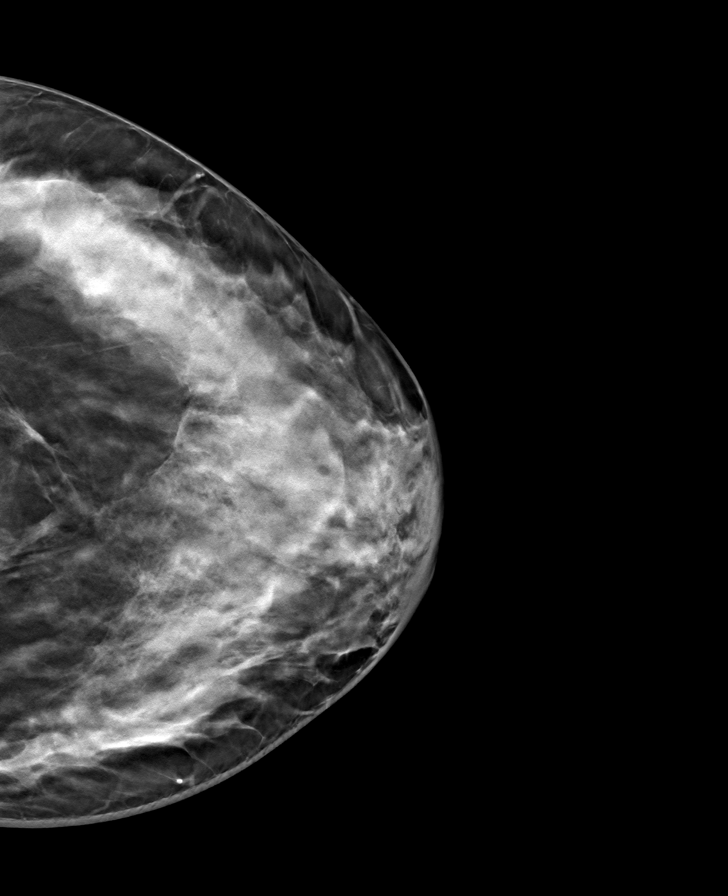

[R MLO tomo · tomo slice 41/82.0]
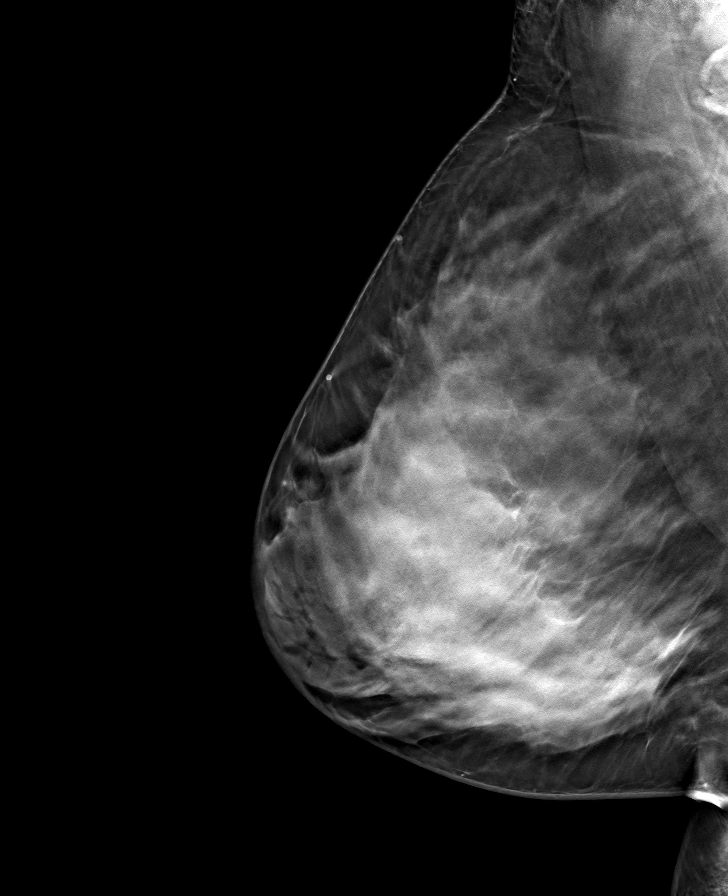

[L MLO tomo · tomo slice 43/86.0]
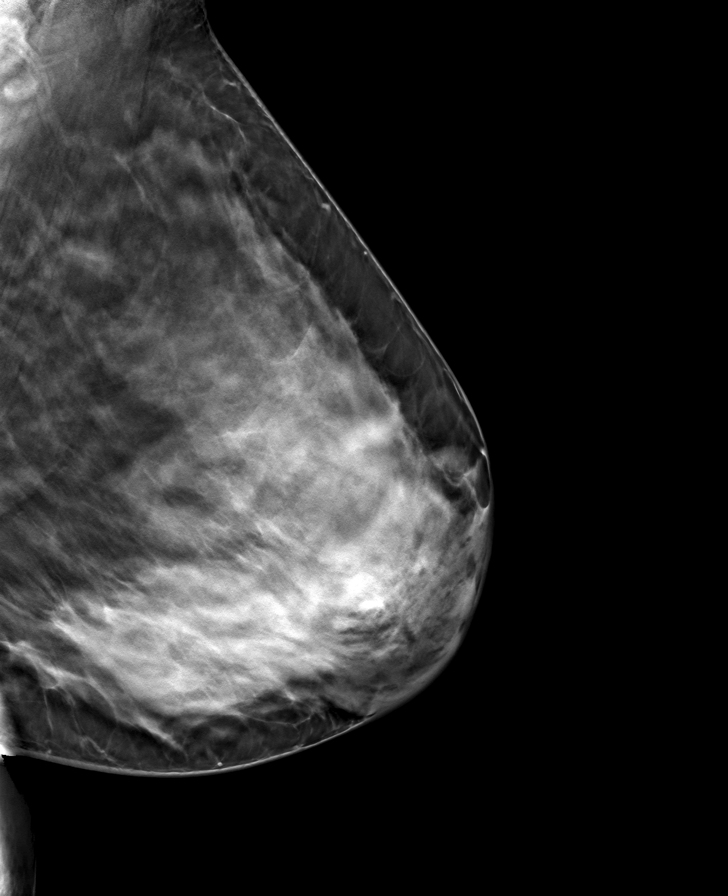

[8 of 24 positions shown; findings below may reference images not displayed]

ACR Breast Density Category d: The breast tissue is extremely dense,
which lowers the sensitivity of mammography.
FINDINGS: There are no findings suspicious for malignancy.
IMPRESSION: No mammographic evidence of malignancy. A result letter of this
screening mammogram will be mailed directly to the patient.

RECOMMENDATION:
Screening mammogram in one year. (Code:KG-P-SWW)

BI-RADS CATEGORY  1: Negative.
